# Patient Record
Sex: Female | Born: 1960 | Race: White | Hispanic: No | Marital: Married | Smoking: Never smoker
Health system: Southern US, Community
[De-identification: ages and names within clinical notes are randomized; demographics above are authoritative.]

## PROBLEM LIST (undated history)

## (undated) DIAGNOSIS — S86019A Strain of unspecified Achilles tendon, initial encounter: Secondary | ICD-10-CM

## (undated) DIAGNOSIS — G4730 Sleep apnea, unspecified: Secondary | ICD-10-CM

## (undated) DIAGNOSIS — R7303 Prediabetes: Secondary | ICD-10-CM

## (undated) DIAGNOSIS — I10 Essential (primary) hypertension: Secondary | ICD-10-CM

## (undated) DIAGNOSIS — Z9109 Other allergy status, other than to drugs and biological substances: Secondary | ICD-10-CM

## (undated) DIAGNOSIS — R739 Hyperglycemia, unspecified: Secondary | ICD-10-CM

## (undated) DIAGNOSIS — E7800 Pure hypercholesterolemia, unspecified: Secondary | ICD-10-CM

## (undated) HISTORY — DX: Essential (primary) hypertension: I10

## (undated) HISTORY — DX: Pure hypercholesterolemia, unspecified: E78.00

## (undated) HISTORY — DX: Other allergy status, other than to drugs and biological substances: Z91.09

## (undated) HISTORY — DX: Sleep apnea, unspecified: G47.30

## (undated) HISTORY — DX: Hyperglycemia, unspecified: R73.9

## (undated) HISTORY — DX: Strain of unspecified achilles tendon, initial encounter: S86.019A

---

## 2006-02-18 ENCOUNTER — Ambulatory Visit: Payer: Self-pay | Admitting: Internal Medicine

## 2006-03-27 ENCOUNTER — Ambulatory Visit: Payer: Self-pay | Admitting: Internal Medicine

## 2006-04-26 ENCOUNTER — Ambulatory Visit: Payer: Self-pay | Admitting: Internal Medicine

## 2006-06-30 HISTORY — PX: BREAST BIOPSY: SHX20

## 2007-04-15 ENCOUNTER — Ambulatory Visit: Payer: Self-pay | Admitting: Internal Medicine

## 2008-05-05 ENCOUNTER — Ambulatory Visit: Payer: Self-pay | Admitting: Internal Medicine

## 2009-04-23 ENCOUNTER — Ambulatory Visit: Payer: Self-pay | Admitting: Internal Medicine

## 2009-05-10 ENCOUNTER — Ambulatory Visit: Payer: Self-pay | Admitting: Internal Medicine

## 2009-05-17 ENCOUNTER — Ambulatory Visit: Payer: Self-pay | Admitting: Internal Medicine

## 2009-06-27 ENCOUNTER — Ambulatory Visit: Payer: Self-pay | Admitting: Unknown Physician Specialty

## 2009-07-03 ENCOUNTER — Ambulatory Visit: Payer: Self-pay | Admitting: Unknown Physician Specialty

## 2009-07-03 HISTORY — PX: OTHER SURGICAL HISTORY: SHX169

## 2009-10-29 IMAGING — US ABDOMEN ULTRASOUND
1 series · 17 of 25 positions shown · non-contrast
Comparison: none

REASON FOR EXAM: abdominal pain
COMMENTS:

[Series 1: abdomen ultrasound · 17 of 100 slices shown]
[im 1/100]
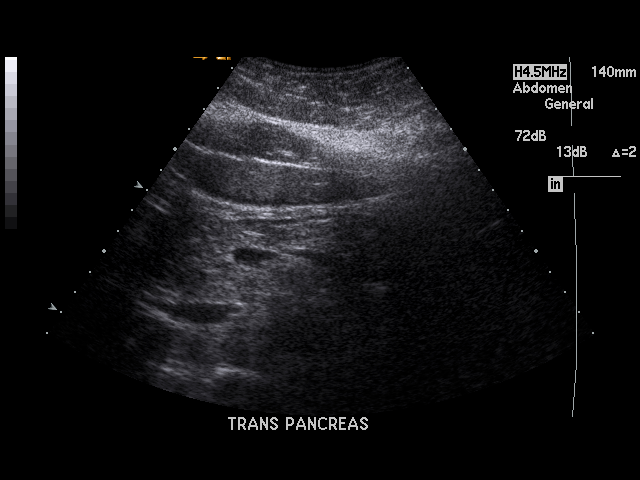
[im 9/100]
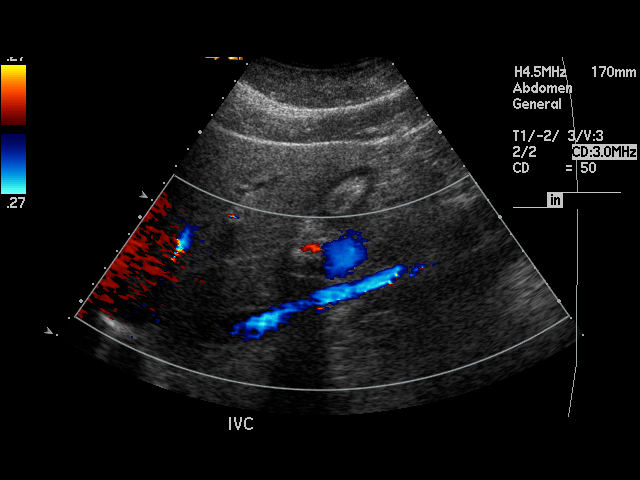
[im 13/100]
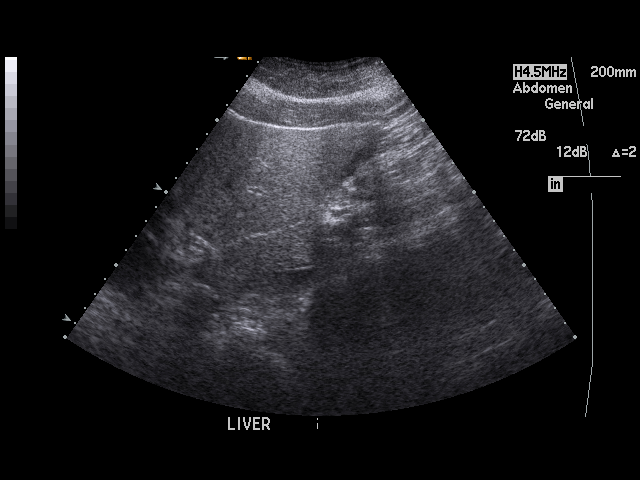
[im 21/100]
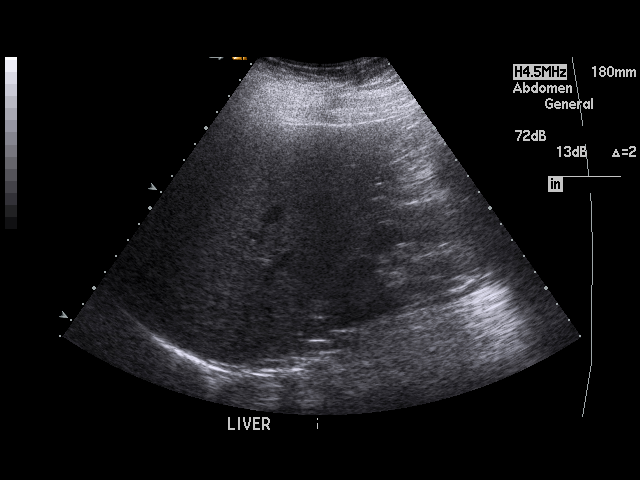
[im 25/100]
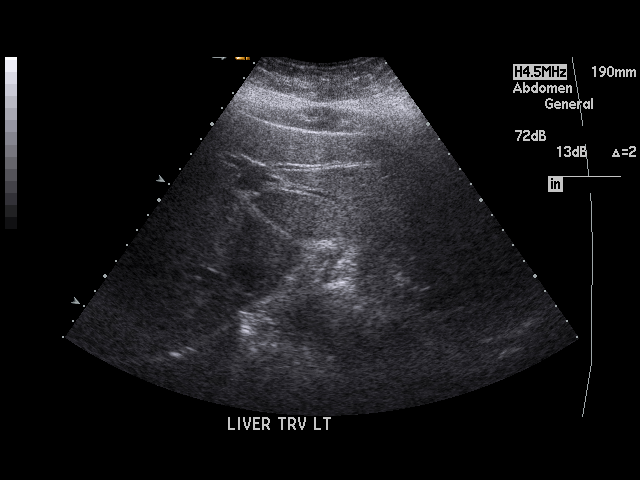
[im 34/100]
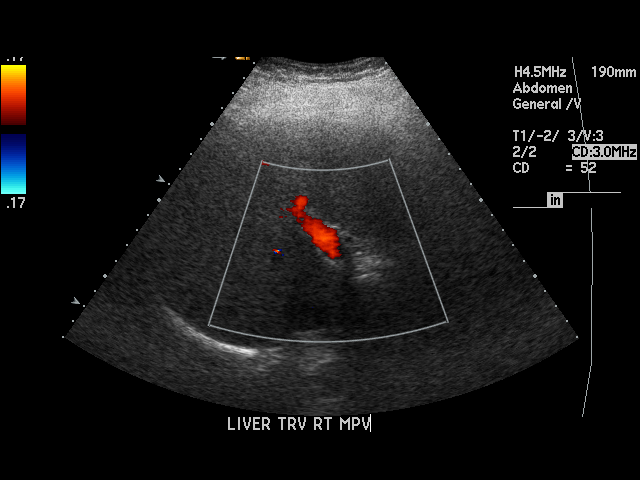
[im 38/100]
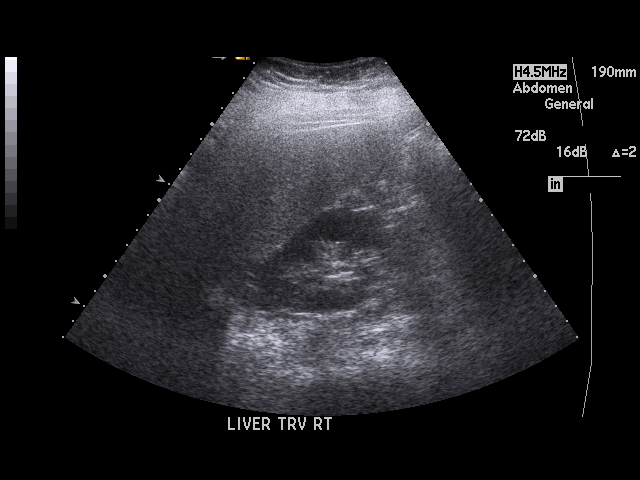
[im 46/100]
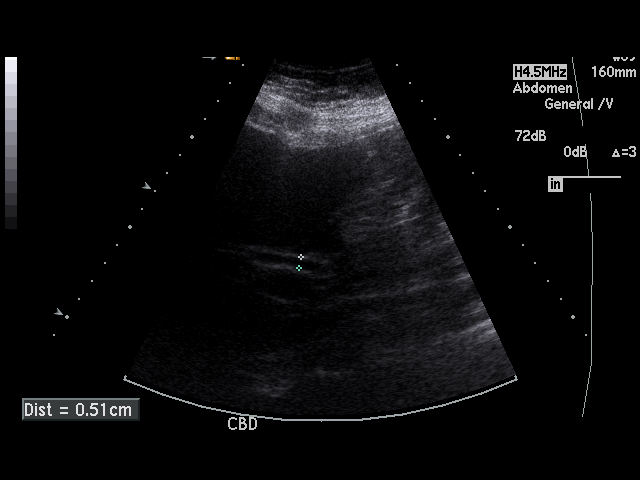
[im 50/100]
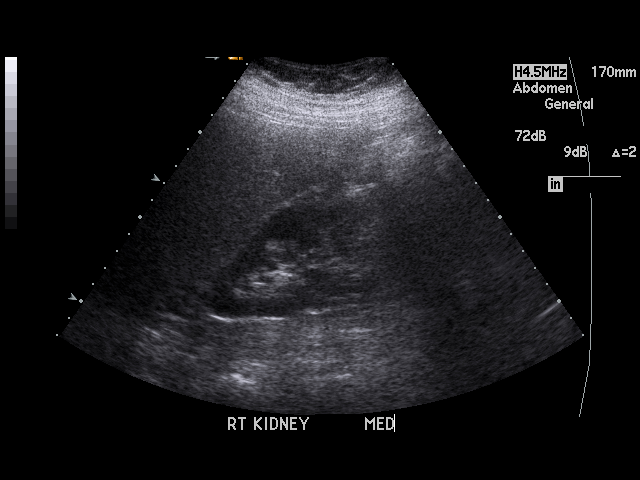
[im 54/100]
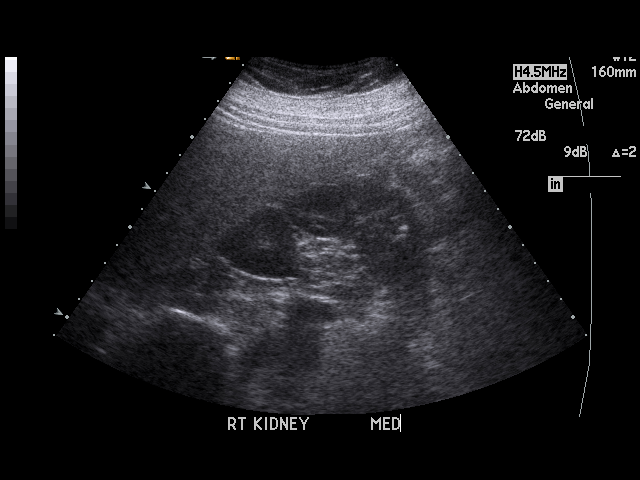
[im 62/100]
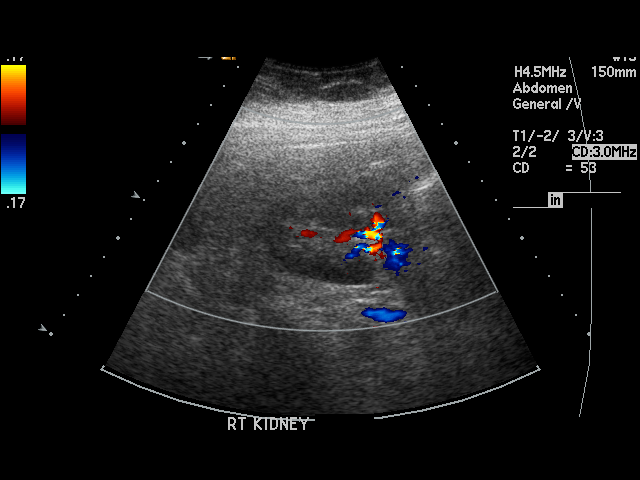
[im 67/100]
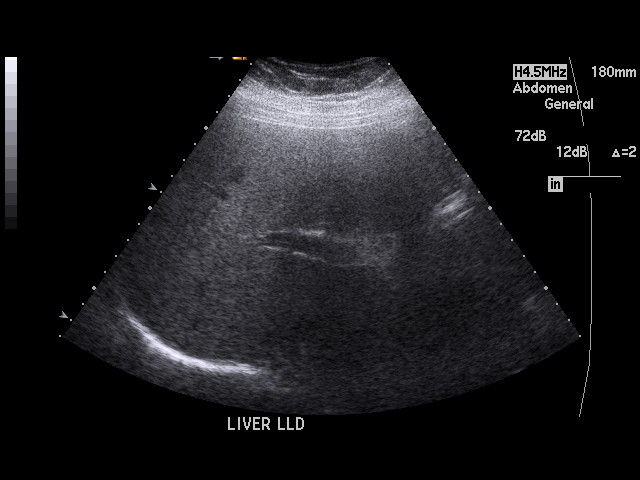
[im 75/100]
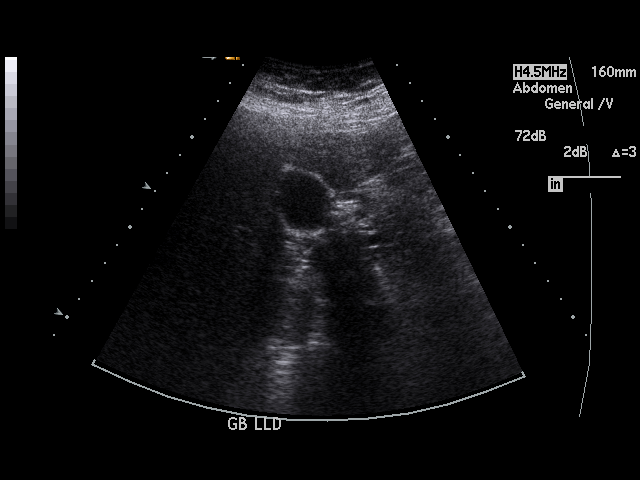
[im 79/100]
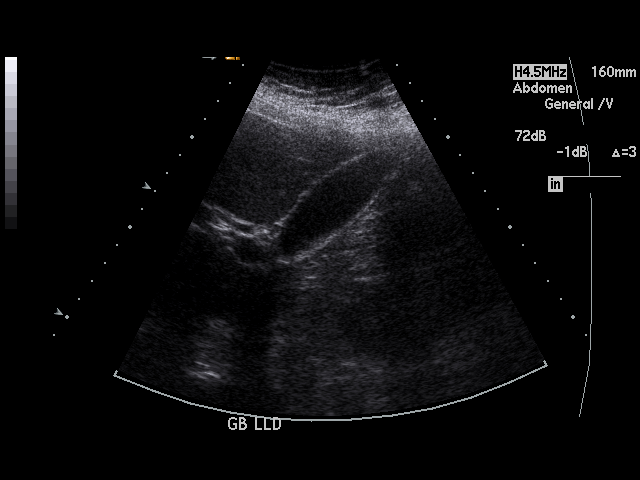
[im 87/100]
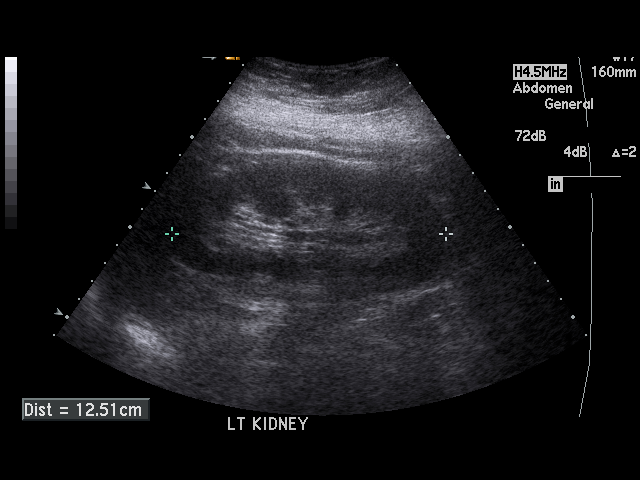
[im 91/100]
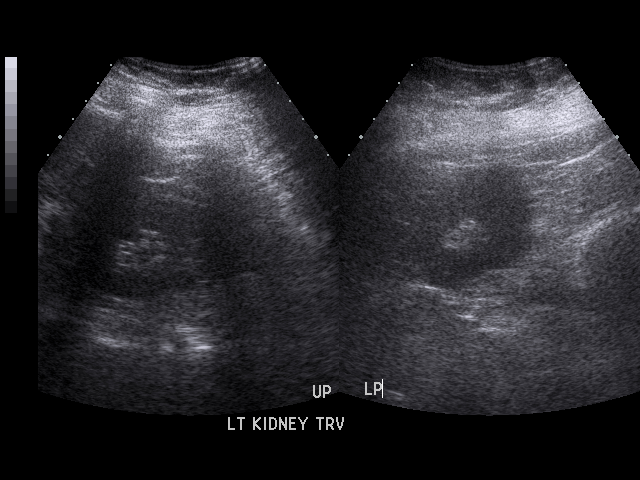
[im 100/100]
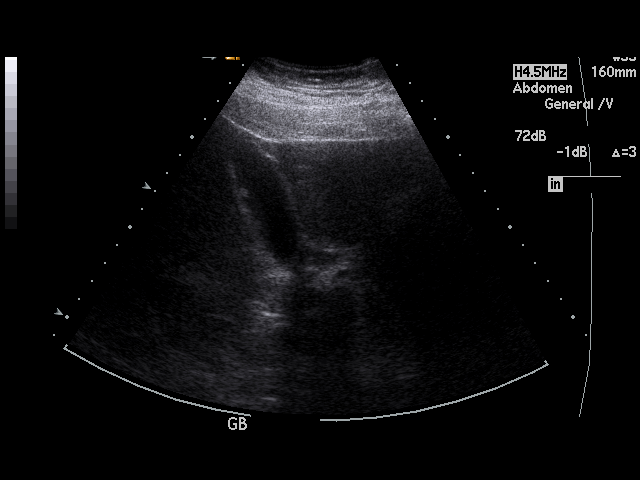

[17 of 25 positions shown; findings below may reference images not displayed]

PROCEDURE:     US  - US ABDOMEN GENERAL SURVEY  - April 23, 2009  [DATE]

RESULT:     Sonographic evaluation of the abdomen is performed in the
standard fashion. The pancreas is only partially visualized with no gross
abnormality seen. The included images of the aorta and inferior vena cava
show no acute abnormality. The hepatic echotexture appears to be normal with
no ductal dilation. The common bile duct diameter is somewhat marginal in
size being just above the upper limits of normal at 5.1 mm. There is a small
area of echogenicity along the gallbladder wall which is nonmobile and may
represent a 4.3 mm polyp. The gallbladder wall thickness is 2.0 mm. The
spleen and kidneys appear unremarkable.
IMPRESSION: 1. Probable small gallbladder wall polyp.
2. Limited visualization of the pancreas because of overlying bowel gas.

## 2010-01-02 IMAGING — CR DG CHEST 2V
1 series · 2 of 2 positions shown · non-contrast
Comparison: none

REASON FOR EXAM: htn
COMMENTS:

PROCEDURE:     DXR - DXR CHEST PA (OR AP) AND LATERAL  - June 27, 2009  [DATE]
RESULT:     There is shallow inspiratory effort. Basilar atelectasis is
suggested bilaterally. The heart is normal in size. There is no edema,
infiltrate, effusion or pneumothorax.

[Series 1: view not recorded · 0.17mm/px · 2 of 2 slices shown]
[im 1/2]
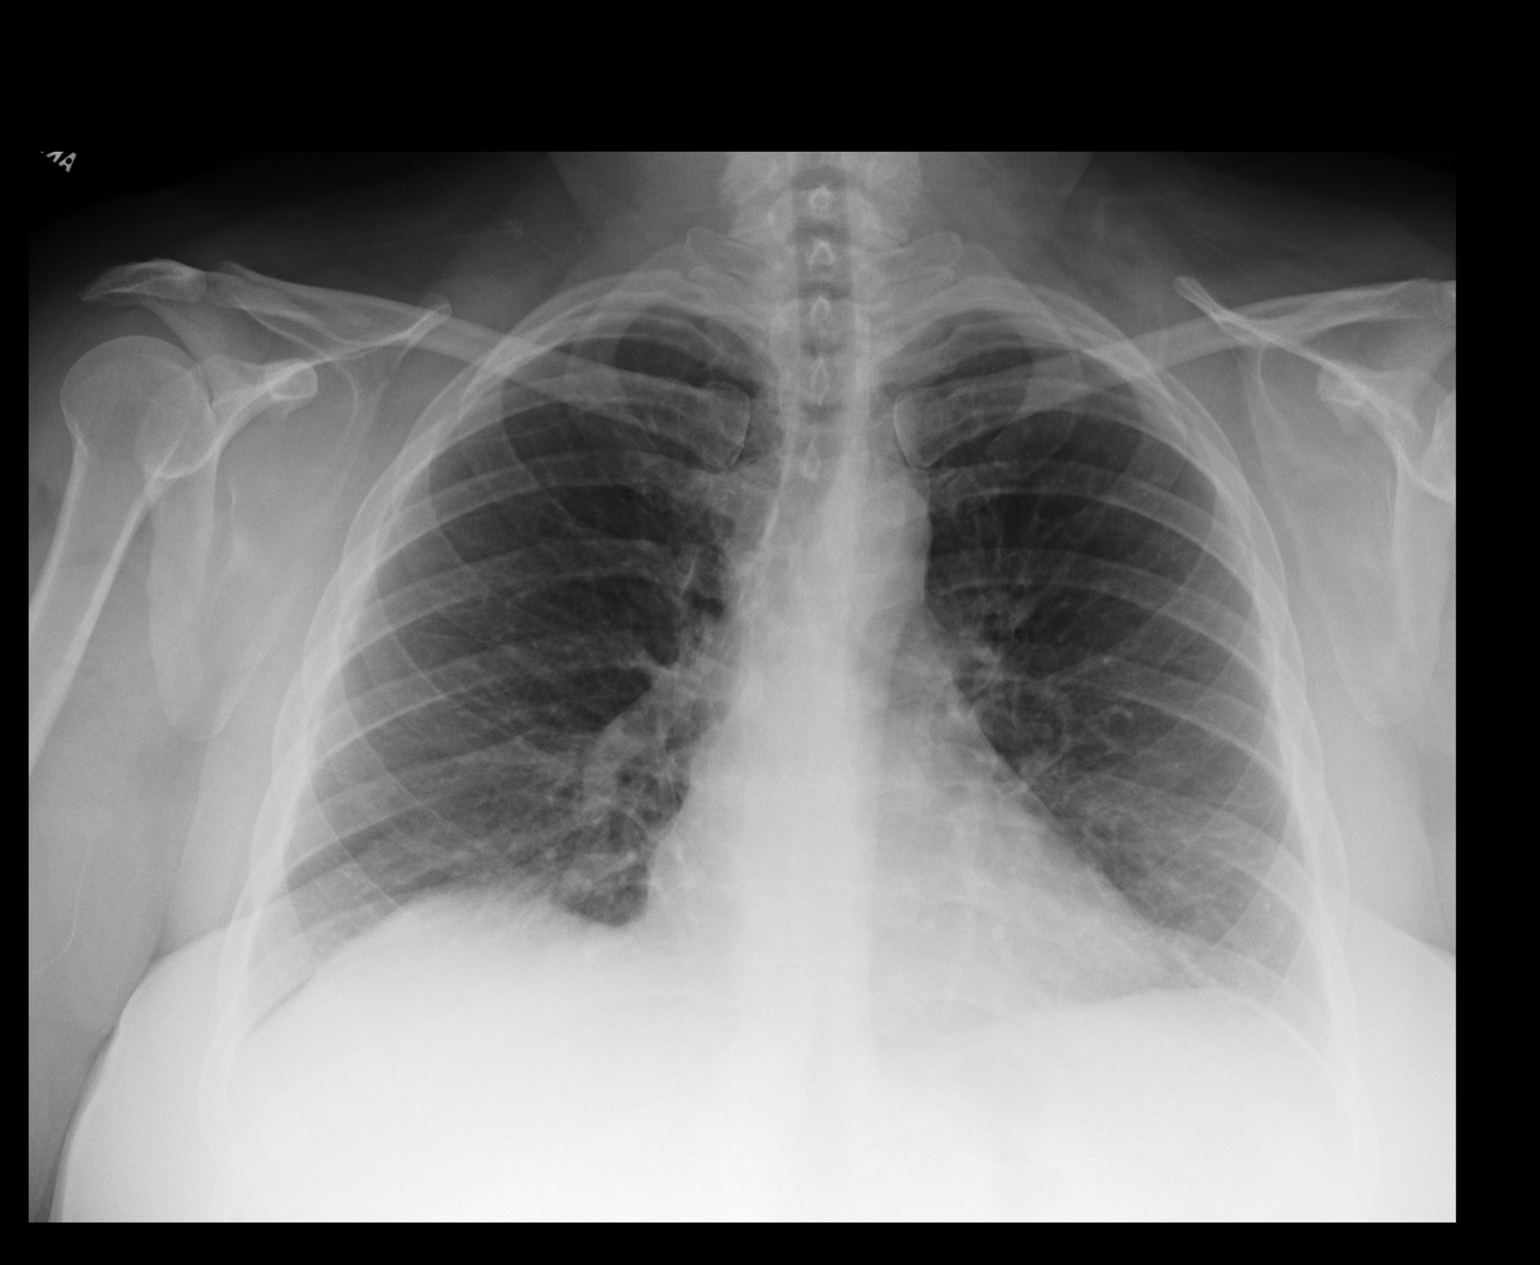
[im 2/2]
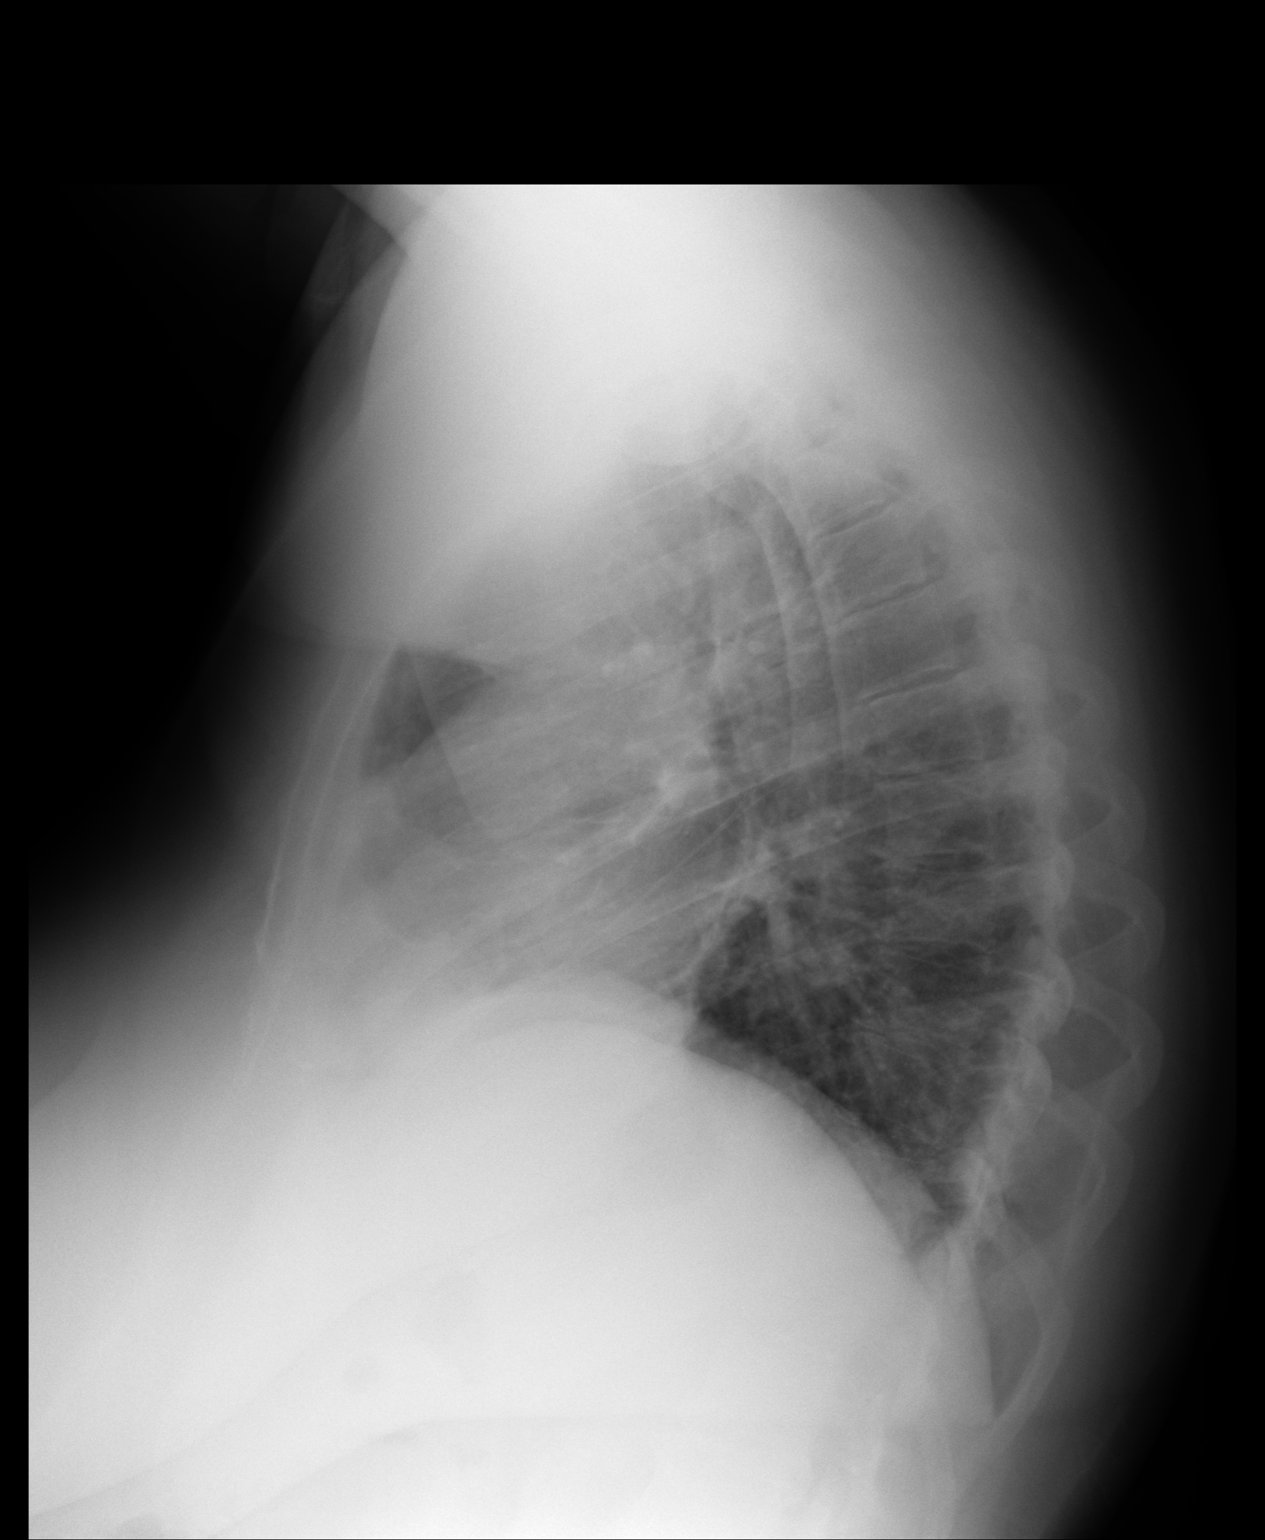

[2 of 2 positions shown; findings below may reference images not displayed]

IMPRESSION: 1. Shallow inspiration with basilar atelectasis bilaterally. This is minimal
linear atelectasis.

## 2010-05-15 ENCOUNTER — Ambulatory Visit: Payer: Self-pay | Admitting: Internal Medicine

## 2010-07-08 ENCOUNTER — Ambulatory Visit: Payer: Self-pay | Admitting: Internal Medicine

## 2010-07-31 ENCOUNTER — Ambulatory Visit: Payer: Self-pay | Admitting: Internal Medicine

## 2010-08-29 ENCOUNTER — Ambulatory Visit: Payer: Self-pay | Admitting: Internal Medicine

## 2012-05-25 ENCOUNTER — Encounter: Payer: Self-pay | Admitting: *Deleted

## 2012-05-26 ENCOUNTER — Encounter: Payer: Self-pay | Admitting: Internal Medicine

## 2012-05-26 ENCOUNTER — Ambulatory Visit (INDEPENDENT_AMBULATORY_CARE_PROVIDER_SITE_OTHER): Payer: BC Managed Care – PPO | Admitting: Internal Medicine

## 2012-05-26 VITALS — BP 132/78 | HR 96 | Temp 98.8°F | Ht 62.0 in | Wt 228.0 lb

## 2012-05-26 DIAGNOSIS — E119 Type 2 diabetes mellitus without complications: Secondary | ICD-10-CM | POA: Insufficient documentation

## 2012-05-26 DIAGNOSIS — R5383 Other fatigue: Secondary | ICD-10-CM

## 2012-05-26 DIAGNOSIS — R7309 Other abnormal glucose: Secondary | ICD-10-CM

## 2012-05-26 DIAGNOSIS — I10 Essential (primary) hypertension: Secondary | ICD-10-CM | POA: Insufficient documentation

## 2012-05-26 DIAGNOSIS — G4730 Sleep apnea, unspecified: Secondary | ICD-10-CM | POA: Insufficient documentation

## 2012-05-26 DIAGNOSIS — E7800 Pure hypercholesterolemia, unspecified: Secondary | ICD-10-CM

## 2012-05-26 DIAGNOSIS — R739 Hyperglycemia, unspecified: Secondary | ICD-10-CM

## 2012-05-26 LAB — BASIC METABOLIC PANEL
BUN: 15 mg/dL (ref 6–23)
Chloride: 102 mEq/L (ref 96–112)
GFR: 126.64 mL/min (ref >60.00)
Potassium: 3.7 mEq/L (ref 3.5–5.1)
Sodium: 137 mEq/L (ref 135–145)

## 2012-05-26 LAB — CBC WITH DIFFERENTIAL/PLATELET
Eosinophils Relative: 1.8 % (ref 0.0–5.0)
HCT: 43.2 % (ref 36.0–46.0)
Hemoglobin: 14.5 g/dL (ref 12.0–15.0)
Lymphs Abs: 2.6 10*3/uL (ref 0.7–4.0)
Monocytes Relative: 4.4 % (ref 3.0–12.0)
Neutro Abs: 4.4 10*3/uL (ref 1.4–7.7)
Platelets: 388 10*3/uL (ref 150.0–400.0)
WBC: 7.5 10*3/uL (ref 4.5–10.5)

## 2012-05-26 LAB — HEMOGLOBIN A1C: Hgb A1c MFr Bld: 6.9 % — ABNORMAL HIGH (ref 4.6–6.5)

## 2012-05-26 LAB — LDL CHOLESTEROL, DIRECT: Direct LDL: 186.5 mg/dL

## 2012-05-26 LAB — LIPID PANEL
Cholesterol: 238 mg/dL — ABNORMAL HIGH (ref 0–200)
Total CHOL/HDL Ratio: 7
VLDL: 39.2 mg/dL (ref 0.0–40.0)

## 2012-05-26 LAB — HEPATIC FUNCTION PANEL
ALT: 26 U/L (ref 0–35)
AST: 22 U/L (ref 0–37)
Alkaline Phosphatase: 55 U/L (ref 39–117)
Total Bilirubin: 0.7 mg/dL (ref 0.3–1.2)

## 2012-05-26 LAB — TSH: TSH: 2.63 u[IU]/mL (ref 0.35–5.50)

## 2012-05-26 NOTE — Progress Notes (Signed)
  Subjective:    Patient ID: Caroline Meyers, female    DOB: 1961-04-14, 51 y.o.   MRN: [id number]  HPI 51 year old female with past history of hypertension, hypercholesterolemia, hyperglycemia and sleep apnea who comes in today for a scheduled follow up.  She has not been taking her HCTZ for 2 months.  Not following her pressure regularly.  Blood pressure when she has had it checked - 130 systolic readings.  She previously fell and injured her neck.  Had a bulging disc in her neck.  Went to PT for evaluation and treatment.  Breathing stable.  Recently treated for a respiratory infection.  She has recently adjusted her diet and has started walking.  Plans to get more serious about diet and exercise.  She has been having problems with a trigger finger - left fourth finger.    Past Medical History  Diagnosis Date  . Environmental allergies   . Hypercholesteremia   . Hypertension   . Sleep apnea   . Achilles tendon tear     age 89  . Hyperglycemia     Review of Systems Patient denies any headache, lightheadedness or dizziness.  No increased sinus congestion or drainage.  No chest pain, tightness or palpitations.  No increased shortness of breath, cough or congestion.  No nausea or vomiting.  No abdominal pain or cramping.  No bowel change, such as diarrhea, constipation, BRBPR or melana.  No urine change.        Objective:   Physical Exam Filed Vitals:   05/26/12 0813  BP: 132/78  Pulse: 96  Temp: 98.8 F (21.37 C)   51 year old in no acute distress.   HEENT:  Nares - clear.  OP- without lesions or erythema.  NECK:  Supple, nontender.  No audible bruit.   HEART:  Appears to be regular. LUNGS:  Without crackles or wheezing audible.  Respirations even and unlabored.   RADIAL PULSE:  Equal bilaterally.  ABDOMEN:  Soft, nontender.  No audible abdominal bruit.   EXTREMITIES:  No increased edema to be present.                     Assessment & Plan:  MSK.  S/P lumbar microdiskectomy 07/03/09.    Doing well.  Recently had problems with a bulging disc in her neck. PT.  Doing better now.    GI.  Previous abdominal ultrasound revealed a small gallbladder polyp.  Saw Dr Katrinka Blazing.  Felt no further w/up warranted.  Currently asymptomatic.    CARDIOVASCULAR.  Previous ECHO revealed LVH changes with mild mitral regurgitation and mild tricuspid regurgitation.   Currently asymptomatic.    INCREASED PSYCHOSOCIAL STRESSORS.  Doing well.  Follow.   BASAL CELL CARCINOMA.  Sees Dr Cheree Ditto.     TRIGGER FINGER.  Place finger splint.  Notify me if persistent problems.  If persistent problems, will need referral for possible injection.   GYN.  Followed by GYN for pelvic and pap.  Due follow up.  Plans to call for appt soon.    HEALTH MAINTENANCE.  GYN exams through her gynecologist.  Need to obtain mammogram results.

## 2012-05-26 NOTE — Patient Instructions (Addendum)
It was nice seeing you today.  I want you to continue to exercise and adjust your diet.  Monitor your blood pressure and send me readings in a few weeks.  Let me know if any problems.

## 2012-05-27 ENCOUNTER — Encounter: Payer: Self-pay | Admitting: Internal Medicine

## 2012-05-27 NOTE — Assessment & Plan Note (Signed)
Low cholesterol diet and exercise.  On no medication.  Check lipid panel.  

## 2012-05-27 NOTE — Assessment & Plan Note (Signed)
Follow

## 2012-05-27 NOTE — Assessment & Plan Note (Signed)
Low carb diet, exercise and weight loss.  Check fasting sugar.

## 2012-05-27 NOTE — Assessment & Plan Note (Signed)
On no medication now.  Blood pressure on my check today - 132/78.  Remain off medication now.  Follow.

## 2012-06-07 ENCOUNTER — Telehealth: Payer: Self-pay | Admitting: Internal Medicine

## 2012-06-07 NOTE — Telephone Encounter (Signed)
Pt needs an appt in 2-3 weeks (to follow up regarding her blood sugar and cholesterol).  30 minutes if possible.  Thanks.

## 2012-06-07 NOTE — Progress Notes (Signed)
Patient stated that she would call us back to make appointment

## 2012-06-08 NOTE — Telephone Encounter (Signed)
Left message for pt to call back and schedule

## 2012-07-17 ENCOUNTER — Telehealth: Payer: Self-pay | Admitting: Internal Medicine

## 2012-07-17 NOTE — Telephone Encounter (Signed)
Pt has never called back to schedule appt.  Needs a follow up appt - to follow up on her cholesterol and sugar.  Thanks.

## 2012-07-21 NOTE — Telephone Encounter (Signed)
scheduled

## 2012-08-09 ENCOUNTER — Encounter: Payer: Self-pay | Admitting: Internal Medicine

## 2012-08-09 ENCOUNTER — Ambulatory Visit (INDEPENDENT_AMBULATORY_CARE_PROVIDER_SITE_OTHER): Payer: BC Managed Care – PPO | Admitting: Internal Medicine

## 2012-08-09 VITALS — BP 110/88 | HR 92 | Temp 98.6°F | Ht 62.0 in | Wt 227.8 lb

## 2012-08-09 DIAGNOSIS — R7309 Other abnormal glucose: Secondary | ICD-10-CM

## 2012-08-09 DIAGNOSIS — I10 Essential (primary) hypertension: Secondary | ICD-10-CM

## 2012-08-09 DIAGNOSIS — R739 Hyperglycemia, unspecified: Secondary | ICD-10-CM

## 2012-08-09 DIAGNOSIS — E7800 Pure hypercholesterolemia, unspecified: Secondary | ICD-10-CM

## 2012-08-09 MED ORDER — ATORVASTATIN CALCIUM 10 MG PO TABS: 10.0000 mg | ORAL_TABLET | Freq: Every day | ORAL | Status: DC

## 2012-08-10 ENCOUNTER — Encounter: Payer: Self-pay | Admitting: Internal Medicine

## 2012-08-10 NOTE — Assessment & Plan Note (Signed)
Recent fasting lipid profile revealed total cholesterol 238, triglycerides 196, HDL 34, LDL 187.  Start lipitor 10mg  q day.  Check liver panel and cholesterol with next fasting labs.  Low cholesterol diet.

## 2012-08-10 NOTE — Assessment & Plan Note (Signed)
Blood pressure on recheck 118/80.  On no medication.  Follow.

## 2012-08-10 NOTE — Progress Notes (Signed)
  Subjective:    Patient ID: Caroline Meyers, female    DOB: [birth year], 52 y.o.   MRN: [id number]  HPI 52 year old female with past history of hypertension, hypercholesterolemia, hyperglycemia and sleep apnea who comes in today for a scheduled follow up.  She states she is doing relatively well.  Got a new job.  Stress better.  On no blood pressure medication.  No headache.  No chest pain or tightness. Discussed her recent labs.  Sugar elevated.  We discussed low carb/diabetic diet.  She has been to lifestyles.  Discussed exercise.  No nausea or vomiting.  Bowels stable.  Cholesterol elevated.  Discussed starting cholesterol medication.     Past Medical History  Diagnosis Date  . Environmental allergies   . Hypercholesteremia   . Hypertension   . Sleep apnea   . Achilles tendon tear     age 59  . Hyperglycemia     Current Outpatient Prescriptions on File Prior to Visit  Medication Sig Dispense Refill  . glucose blood test strip Check blood sugar q day      . norethindrone (MICRONOR,CAMILA,ERRIN) 0.35 MG tablet Take 1 tablet by mouth daily.       No current facility-administered medications on file prior to visit.    Review of Systems Patient denies any headache, lightheadedness or dizziness.  No increased sinus congestion or drainage.  No chest pain, tightness or palpitations.  No increased shortness of breath, cough or congestion.  No nausea or vomiting.  No acid reflux.  No abdominal pain or cramping.  No bowel change, such as diarrhea, constipation, BRBPR or melana.  No urine change.        Objective:   Physical Exam  Filed Vitals:   08/09/12 0809  BP: 110/88  Pulse: 92  Temp: 98.6 F (39 C)   52 year old in no acute distress.   HEENT:  Nares - clear.  OP- without lesions or erythema.  NECK:  Supple, nontender.  No audible bruit.   HEART:  Appears to be regular. (1/VI systolic murmur).   LUNGS:  Without crackles or wheezing audible.  Respirations even and unlabored.   RADIAL  PULSE:  Equal bilaterally.  ABDOMEN:  Soft, nontender.  No audible abdominal bruit.   EXTREMITIES:  No increased edema to be present.   SKIN:  Feet without lesions.                      Assessment & Plan:  MSK.  S/P lumbar microdiskectomy 07/03/09.   Doing well.  Recently had problems with a bulging disc in her neck. PT.  Doing better now.    GI.  Previous abdominal ultrasound revealed a small gallbladder polyp.  Saw Dr Katrinka Blazing.  Felt no further w/up warranted.  Currently asymptomatic.    CARDIOVASCULAR.  Previous ECHO revealed LVH changes with mild mitral regurgitation and mild tricuspid regurgitation.   Currently asymptomatic.    INCREASED PSYCHOSOCIAL STRESSORS.  Doing well.  Follow.   BASAL CELL CARCINOMA.  Sees Dr Cheree Ditto.      GYN.  Followed by GYN for pelvic and pap.    HEALTH MAINTENANCE.  GYN exams through her gynecologist.  Obtain mammogram results.

## 2012-08-10 NOTE — Assessment & Plan Note (Signed)
Recent a1c 6.8.  Low carb/diabetic diet.  Follow.  Exercise.

## 2012-08-27 ENCOUNTER — Telehealth: Payer: Self-pay | Admitting: *Deleted

## 2012-08-27 NOTE — Telephone Encounter (Signed)
Called patient to find out what was going on. Patient stated that she was having swelling in hands and feet. Also having joint pain. Patient wants to wait and see how she feels after this weekend. She said that she is not sick or anything but she thinks that it is the atorvastatin that is making her feel this way. She will let us know if there is a change.

## 2012-08-27 NOTE — Telephone Encounter (Signed)
Patient left message on voicemail stating she is going to stop a medication that was prescribed to her by Dr. Lorin Picket, she did not leave the medication name. But she feels uncomfortable taking it and is not going to take it anymore. Would like a return call.

## 2012-10-07 ENCOUNTER — Other Ambulatory Visit (INDEPENDENT_AMBULATORY_CARE_PROVIDER_SITE_OTHER): Payer: BC Managed Care – PPO

## 2012-10-07 DIAGNOSIS — R7309 Other abnormal glucose: Secondary | ICD-10-CM

## 2012-10-07 DIAGNOSIS — I10 Essential (primary) hypertension: Secondary | ICD-10-CM

## 2012-10-07 DIAGNOSIS — R739 Hyperglycemia, unspecified: Secondary | ICD-10-CM

## 2012-10-07 DIAGNOSIS — E7800 Pure hypercholesterolemia, unspecified: Secondary | ICD-10-CM

## 2012-10-07 LAB — MICROALBUMIN / CREATININE URINE RATIO
Creatinine,U: 196.2 mg/dL
Microalb, Ur: 4.9 mg/dL — ABNORMAL HIGH (ref 0.0–1.9)

## 2012-10-07 LAB — BASIC METABOLIC PANEL
BUN: 15 mg/dL (ref 6–23)
CO2: 26 mEq/L (ref 19–32)
Calcium: 9.3 mg/dL (ref 8.4–10.5)
Glucose, Bld: 115 mg/dL — ABNORMAL HIGH (ref 70–99)
Sodium: 138 mEq/L (ref 135–145)

## 2012-10-07 LAB — HEPATIC FUNCTION PANEL
ALT: 22 U/L (ref 0–35)
Bilirubin, Direct: 0.1 mg/dL (ref 0.0–0.3)
Total Bilirubin: 1.1 mg/dL (ref 0.3–1.2)

## 2012-10-07 LAB — LIPID PANEL
HDL: 30.5 mg/dL — ABNORMAL LOW (ref >39.00)
VLDL: 39.6 mg/dL (ref 0.0–40.0)

## 2012-10-07 LAB — LDL CHOLESTEROL, DIRECT: Direct LDL: 176.7 mg/dL

## 2012-10-13 ENCOUNTER — Ambulatory Visit (INDEPENDENT_AMBULATORY_CARE_PROVIDER_SITE_OTHER): Payer: BC Managed Care – PPO | Admitting: Internal Medicine

## 2012-10-13 ENCOUNTER — Encounter: Payer: Self-pay | Admitting: Internal Medicine

## 2012-10-13 VITALS — BP 130/80 | HR 85 | Temp 98.8°F | Ht 62.0 in | Wt 229.5 lb

## 2012-10-13 DIAGNOSIS — R739 Hyperglycemia, unspecified: Secondary | ICD-10-CM

## 2012-10-13 DIAGNOSIS — G4730 Sleep apnea, unspecified: Secondary | ICD-10-CM

## 2012-10-13 DIAGNOSIS — R7309 Other abnormal glucose: Secondary | ICD-10-CM

## 2012-10-13 DIAGNOSIS — E7800 Pure hypercholesterolemia, unspecified: Secondary | ICD-10-CM

## 2012-10-13 DIAGNOSIS — I10 Essential (primary) hypertension: Secondary | ICD-10-CM

## 2012-10-13 MED ORDER — PRAVASTATIN SODIUM 10 MG PO TABS: 10.0000 mg | ORAL_TABLET | Freq: Every day | ORAL | Status: DC

## 2012-10-16 ENCOUNTER — Encounter: Payer: Self-pay | Admitting: Internal Medicine

## 2012-10-16 NOTE — Assessment & Plan Note (Signed)
Recent fasting lipid profile revealed total cholesterol 242, triglycerides 198, HDL 31, LDL 177.  Low cholesterol diet.  Had aches with lipitor.  Will start pravastatin 10mg .  Check liver panel in 6 weeks.

## 2012-10-16 NOTE — Assessment & Plan Note (Signed)
Blood pressure appears to be doing well.  On no medication.  Follow.

## 2012-10-16 NOTE — Assessment & Plan Note (Signed)
Recent a1c 6.7.  Low carb/diabetic diet.  Follow.  Exercise.

## 2012-10-16 NOTE — Progress Notes (Signed)
Subjective:    Patient ID: Caroline Meyers, female    DOB: 11-10-1960, 52 y.o.   MRN: [id number]  HPI 52 year old female with past history of hypertension, hypercholesterolemia, hyperglycemia and sleep apnea who comes in today for a scheduled follow up.  She states she is dong relatively well.  Increased stress,  Father-n-law's health issues.  Not eating like she should.  Checking her sugars.  AM sugar 110-112.  Highest sugar reading she has recorded 141.  On no blood pressure medication.  No headache.  No chest pain or tightness. Discussed her recent labs.  Sugar elevated.  cholesterol elevated.  We discussed low carb/diabetic diet.  She has been to lifestyles.  Discussed exercise.  No nausea or vomiting.  Bowels stable.  Discussed starting cholesterol medication.     Past Medical History  Diagnosis Date  . Environmental allergies   . Hypercholesteremia   . Hypertension   . Sleep apnea   . Achilles tendon tear     age 20  . Hyperglycemia     Current Outpatient Prescriptions on File Prior to Visit  Medication Sig Dispense Refill  . glucose blood test strip Check blood sugar q day      . norethindrone (MICRONOR,CAMILA,ERRIN) 0.35 MG tablet Take 1 tablet by mouth daily.       No current facility-administered medications on file prior to visit.    Review of Systems Patient denies any headache, lightheadedness or dizziness.  No increased sinus congestion or drainage.  No chest pain, tightness or palpitations.  No increased shortness of breath, cough or congestion.  No nausea or vomiting.  No acid reflux.  No abdominal pain or cramping.  No bowel change, such as diarrhea, constipation, BRBPR or melana.  No urine change.  Increased stress as outlined.  She feels she is handling things relatively well.  Plans to get more serious about her diet and exercise.  Previously stopped cholesterol medication secondary to joint pain.       Objective:   Physical Exam  Filed Vitals:   10/13/12 0929  BP:  130/80  Pulse: 85  Temp: 98.8 F (37.1 C)   Blood pressure recheck:  124/82, pulse 39  52 year old in no acute distress.   HEENT:  Nares - clear.  OP- without lesions or erythema.  NECK:  Supple, nontender.  No audible bruit.   HEART:  Appears to be regular. (1/VI systolic murmur).   LUNGS:  Without crackles or wheezing audible.  Respirations even and unlabored.   RADIAL PULSE:  Equal bilaterally.  ABDOMEN:  Soft, nontender.  No audible abdominal bruit.   EXTREMITIES:  No increased edema to be present.   SKIN:  Feet without lesions.                      Assessment & Plan:  MSK.  S/P lumbar microdiskectomy 07/03/09.   Doing well.  Recently had problems with a bulging disc in her neck. PT.  Doing better now.    GI.  Previous abdominal ultrasound revealed a small gallbladder polyp.  Saw Dr Katrinka Blazing.  Felt no further w/up warranted.  Currently asymptomatic.    CARDIOVASCULAR.  Previous ECHO revealed LVH changes with mild mitral regurgitation and mild tricuspid regurgitation.   Currently asymptomatic.    INCREASED PSYCHOSOCIAL STRESSORS.  Increased stress as outlined.  Feels she is handling things relatively well.  Follow.   BASAL CELL CARCINOMA.  Sees Dr Cheree Ditto.  GYN.  Followed by GYN for pelvic and pap.    HEALTH MAINTENANCE.  GYN exams through her gynecologist.  Obtain mammogram results.

## 2012-10-16 NOTE — Assessment & Plan Note (Signed)
Follow

## 2012-11-14 ENCOUNTER — Emergency Department: Payer: Self-pay | Admitting: Emergency Medicine

## 2012-11-17 ENCOUNTER — Encounter: Payer: Self-pay | Admitting: Internal Medicine

## 2012-11-17 ENCOUNTER — Telehealth: Payer: Self-pay | Admitting: Internal Medicine

## 2012-11-17 NOTE — Telephone Encounter (Signed)
Patient Information:  Caller Name: Lillyanna  Phone: [phone]  Patient: Caroline Meyers, Caroline Meyers  Gender: Female  DOB: 1961/06/20  Age: 52 Years  PCP: [facility]  Pregnant: No  Office Follow Up:  Does the office need to follow up with this patient?: No  Instructions For The Office: (See other note in chart where pt called today about her cholesterol medication).   Symptoms  Reason For Call & Symptoms: Pt calling today 11/17/12 regarding she was in ED on 11/14/12 for back pain related to sciatica.  Was prescribed Hydrocodone, Prednisone, and another medication.  Said she is feeling a lot better but still having some mild throbbing.  Wants to know if she should be better by now.  Pt said she does not take the Hydrocodone unless pain is more severe.  Also wants to know if she should be doing any exercises.  Reviewed Health History In EMR: Yes  Reviewed Medications In EMR: Yes  Reviewed Allergies In EMR: Yes  Reviewed Surgeries / Procedures: Yes  Date of Onset of Symptoms: 11/13/2012  Treatments Tried: medication  Treatments Tried Worked: Yes OB / GYN:  LMP: Unknown  Guideline(s) Used:  Back Pain  Disposition Per Guideline:   Home Care  Reason For Disposition Reached:   Back pain  Advice Given:  Reassurance:  Twisting or heavy lifting can cause back pain.  With treatment, the pain most often goes away in 1-2 weeks.  You can treat most back pain at home.  Here is some care advice that should help.  Cold or Heat:  Heat Pack: If pain lasts over 2 days, apply heat to the sore area. Use a heat pack, heating pad, or warm wet washcloth. Do this for 10 minutes, then as needed. For widespread stiffness, take a hot bath or hot shower instead. Move the sore area under the warm water.  Sleep:  Sleep on your side with a pillow between your knees. If you sleep on your back, put a pillow under your knees.  Avoid sleeping on your stomach.  Your mattress should be firm. Avoid waterbeds.  Activity  Keep doing your day-to-day activities if it is not too painful. Staying active is better than resting.  Avoid anything that makes your pain worse. Avoid heavy lifting, twisting, and too much exercise until your back heals.  You do not need to stay in bed.  Pain Medicines:  Use the lowest amount of medicine that makes your pain feel better.  Call Back If:  Numbness or weakness occur  Bowel/bladder problems occur  Pain lasts for more than 2 weeks  You become worse.  Patient Will Follow Care Advice:  YES

## 2012-11-17 NOTE — Telephone Encounter (Signed)
Pt aware & states that she will call back to schedule a f/u soon

## 2012-11-17 NOTE — Telephone Encounter (Signed)
Caller: Kebrina/Patient; Phone: [phone]; Reason for Call: Pt calling today 11/17/12 regarding Dr.  Lorin Picket put her on Prevastatin for her cholesterol on 10/13/12 and she is supposed to make a follow up appt.  She took herself off of this medication because it was causing her joint pain and making her hungry.  She has not been taking the medication for the past two weeks now.  She wants to know if MD wants to put her on a different medication before she comes in for the follow up appt.  PLEASE CALL PT BACK AT [phone].  Pt pharmacy is [facility] on [address], [phone] in case MD wants to call in new medication.  Thanks.

## 2012-11-17 NOTE — Telephone Encounter (Signed)
Would recommend remaining off the cholesterol medication for now, and let the acute back pain resolve.  Keep f/u appt.  Can discuss more with her then.

## 2012-11-26 ENCOUNTER — Ambulatory Visit (INDEPENDENT_AMBULATORY_CARE_PROVIDER_SITE_OTHER): Payer: BC Managed Care – PPO | Admitting: Adult Health

## 2012-11-26 ENCOUNTER — Encounter: Payer: Self-pay | Admitting: Adult Health

## 2012-11-26 VITALS — BP 126/82 | HR 80 | Resp 12 | Wt 227.0 lb

## 2012-11-26 DIAGNOSIS — M545 Low back pain: Secondary | ICD-10-CM

## 2012-11-26 DIAGNOSIS — M5440 Lumbago with sciatica, unspecified side: Secondary | ICD-10-CM | POA: Insufficient documentation

## 2012-11-26 MED ORDER — METHOCARBAMOL 750 MG PO TABS: ORAL_TABLET | ORAL | Status: DC

## 2012-11-26 NOTE — Assessment & Plan Note (Signed)
Improvement with prednisone taper. Continue Percocet one tablet every 6 hours as needed for pain. Patient has mainly been taking this at bedtime. Continue muscle relaxer 3 times a day as needed for muscle spasms. Ibuprofen 600 mg every 6 hours for 3-4 days. Advised to take with food. Apply ice alternating with heat for 15 minutes at a time. She is to do this 3-4 times a day as needed. Reports loss of bowel or bladder, worsening symptoms. Discussed that low back pain takes weeks for complete resolution. Pain will improve with time; however, it will not occur overnight. Note, greater than 25 minutes were spent in education and counseling regarding low back pain. Patient's anxiety was increased secondary to her history of back surgery and fear of further damage.

## 2012-11-26 NOTE — Progress Notes (Signed)
  Subjective:    Patient ID: Caroline Meyers, female    DOB: 05/02/1961, 52 y.o.   MRN: [id number]  HPI  Patient is a pleasant 52 year old female with history of lumbar microdiscectomy (2011) who presents to clinic for followup recent ED visit for low back pain. [place] of [place] golf cart and noticed immediate low back pain that radiated down her right buttocks and thigh. Patient denies loss of bowel or bladder, numbness or tingling. Patient is ambulatory. [place] and was started on muscle relaxers, pain medication and a prednisone taper. She has completed the prednisone taper. Patient is feeling improvement. However she is concerned that she is still having discomfort. Her concern is mainly stemming from her history of having spinal surgery.   Review of Systems  Genitourinary: Negative for difficulty urinating.  Musculoskeletal: Positive for back pain. Negative for gait problem.  Neurological: Negative for weakness and numbness.  Psychiatric/Behavioral: The patient is nervous/anxious.        Objective:   Physical Exam  Constitutional: She is oriented to person, place, and time.  Overweight, pleasant female in no apparent distress.  Cardiovascular: Normal rate and regular rhythm.   Pulmonary/Chest: Effort normal. No respiratory distress.  Musculoskeletal: She exhibits no edema and no tenderness.  Ambulating without any difficulty. Observed as she got up out of chair without any difficulty.  Neurological: She is alert and oriented to person, place, and time. Coordination normal.  Psychiatric: She has a normal mood and affect. Her behavior is normal. Judgment and thought content normal.          Assessment & Plan:

## 2012-11-26 NOTE — Patient Instructions (Addendum)
   Take ibuprofen 600 mg every 6 hours for the next 4 days. Take with food.  Also take muscle relaxer for muscle spasms. This will make you sleepy so only take this at bedtime when you are working.  Apply ice alternating with heat to the affected areas for 15 min at a time. Do this approximately 3-4 times daily.  Use a firm pillow between your knees when you lie on your side or under your knees when you lie on your back.  Return to clinic if your symptoms are not improving within 6 weeks or sooner if your symptoms worsen.  We can refer you to physical therapy. Please let us know.

## 2013-05-05 ENCOUNTER — Other Ambulatory Visit: Payer: Self-pay

## 2015-08-31 ENCOUNTER — Ambulatory Visit (INDEPENDENT_AMBULATORY_CARE_PROVIDER_SITE_OTHER): Payer: BC Managed Care – PPO | Admitting: Internal Medicine

## 2015-08-31 ENCOUNTER — Encounter: Payer: Self-pay | Admitting: Internal Medicine

## 2015-08-31 ENCOUNTER — Telehealth: Payer: Self-pay | Admitting: *Deleted

## 2015-08-31 VITALS — BP 120/80 | HR 82 | Temp 98.3°F | Resp 18 | Ht 62.0 in | Wt 230.0 lb

## 2015-08-31 DIAGNOSIS — F329 Major depressive disorder, single episode, unspecified: Secondary | ICD-10-CM

## 2015-08-31 DIAGNOSIS — F419 Anxiety disorder, unspecified: Secondary | ICD-10-CM

## 2015-08-31 DIAGNOSIS — E7800 Pure hypercholesterolemia, unspecified: Secondary | ICD-10-CM | POA: Diagnosis not present

## 2015-08-31 DIAGNOSIS — I10 Essential (primary) hypertension: Secondary | ICD-10-CM

## 2015-08-31 DIAGNOSIS — F418 Other specified anxiety disorders: Secondary | ICD-10-CM | POA: Diagnosis not present

## 2015-08-31 DIAGNOSIS — R739 Hyperglycemia, unspecified: Secondary | ICD-10-CM | POA: Diagnosis not present

## 2015-08-31 MED ORDER — CITALOPRAM HYDROBROMIDE 10 MG PO TABS: 10.0000 mg | ORAL_TABLET | Freq: Every day | ORAL | Status: DC

## 2015-08-31 NOTE — Progress Notes (Signed)
Patient ID: Caroline Meyers, female   DOB: 05-Apr-1961, 55 y.o.   MRN: [id number]   Subjective:    Patient ID: Caroline Meyers, female    DOB: February 22, 1961, 55 y.o.   MRN: [id number]  HPI  Patient with past history of hypercholesterolemia, hypertension and hyperglycemia.  She comes in today for a scheduled follow up.  I have not seen her since 2014.  She has not followed up regarding her underlying issues.  States her mother passed away two years ago.  Has had increased stress dealing with her passing.  She did not seek counseling.  States she feels she is passed the need for counseling now.  Has good support.  Does feel she needs something to help level things out.  Discussed treatment.  She reports she has had what she feels are panic attacks.  Has had four major episodes.  One occurred at the dentist.  One occurred while she was sitting in the recliner.  Feels increased stress and feels going to jump out of her skin.  No chest pain or tightness with increased activity or exertion.  No nausea or vomiting.  Bowels stable.     Past Medical History  Diagnosis Date  . Environmental allergies   . Hypercholesteremia   . Hypertension   . Sleep apnea   . Achilles tendon tear     age 55  . Hyperglycemia    Past Surgical History  Procedure Laterality Date  . Lumbar microdiskectomy  07/03/09   Family History  Problem Relation Age of Onset  . Alzheimer's disease      grandmother  . Heart disease      myocardial infarction (41s)- grandfather  . Breast cancer Neg Hx   . Colon cancer Neg Hx   . Hypercholesterolemia Mother   . Hypercholesterolemia Sister    Social History   Social History  . Marital Status: Married    Spouse Name: N/A  . Number of Children: 1  . Years of Education: N/A   Occupational History  . teacher    Social History Main Topics  . Smoking status: Never Smoker   . Smokeless tobacco: Never Used  . Alcohol Use: No  . Drug Use: No  . Sexual Activity: Not Asked   Other Topics  Concern  . None   Social History Narrative    Outpatient Encounter Prescriptions as of 08/31/2015  Medication Sig  . acetaminophen (TYLENOL) 325 MG tablet Take 650 mg by mouth as needed for headache.  . citalopram (CELEXA) 10 MG tablet Take 1 tablet (10 mg total) by mouth daily.  . [DISCONTINUED] glucose blood test strip Reported on 08/31/2015  . [DISCONTINUED] methocarbamol (ROBAXIN) 750 MG tablet Take 2 tablets 3 times a day as needed (Patient not taking: Reported on 08/31/2015)  . [DISCONTINUED] oxyCODONE-acetaminophen (PERCOCET) 7.5-325 MG per tablet Take 1 tablet by mouth every 6 (six) hours as needed for pain. Reported on 08/31/2015   No facility-administered encounter medications on file as of 08/31/2015.    Review of Systems  Constitutional: Negative for appetite change and unexpected weight change.  HENT: Negative for congestion and sinus pressure.   Respiratory: Negative for cough, chest tightness and shortness of breath.   Cardiovascular: Negative for chest pain, palpitations and leg swelling.  Gastrointestinal: Negative for nausea, vomiting, abdominal pain and diarrhea.  Genitourinary: Negative for dysuria and difficulty urinating.  Musculoskeletal: Negative for back pain and joint swelling.  Skin: Negative for color change and rash.  Neurological: Negative  for dizziness and light-headedness.  Psychiatric/Behavioral: Negative for dysphoric mood and agitation.       Objective:    Physical Exam  Constitutional: She appears well-developed and well-nourished. No distress.  HENT:  Nose: Nose normal.  Mouth/Throat: Oropharynx is clear and moist.  Eyes: Conjunctivae are normal. Right eye exhibits no discharge. Left eye exhibits no discharge.  Neck: Neck supple. No thyromegaly present.  Cardiovascular: Normal rate and regular rhythm.   Pulmonary/Chest: Breath sounds normal. No respiratory distress. She has no wheezes.  Abdominal: Soft. Bowel sounds are normal. There is no  tenderness.  Musculoskeletal: She exhibits no edema or tenderness.  Lymphadenopathy:    She has no cervical adenopathy.  Skin: No rash noted. No erythema.  Psychiatric: She has a normal mood and affect. Her behavior is normal.    BP 120/80 mmHg  Pulse 82  Temp(Src) 98.3 F (36.8 C) (Oral)  Resp 18  Ht _0  (1.575 m)  Wt 230 lb (104.327 kg)  BMI 42.06 kg/m2  SpO2 97%  LMP 03/09/2012 Wt Readings from Last 3 Encounters:  08/31/15 230 lb (104.327 kg)  11/26/12 227 lb (102.967 kg)  10/13/12 229 lb 8 oz (104.101 kg)     Lab Results  Component Value Date   WBC 7.5 05/26/2012   HGB 14.5 05/26/2012   HCT 43.2 05/26/2012   PLT 388.0 05/26/2012   GLUCOSE 115* 10/07/2012   CHOL 242* 10/07/2012   TRIG 198.0* 10/07/2012   HDL 30.50* 10/07/2012   LDLDIRECT 176.7 10/07/2012   ALT 22 10/07/2012   AST 19 10/07/2012   NA 138 10/07/2012   K 3.9 10/07/2012   CL 103 10/07/2012   CREATININE 0.5 10/07/2012   BUN 15 10/07/2012   CO2 26 10/07/2012   TSH 2.63 05/26/2012   HGBA1C 6.7* 10/07/2012   MICROALBUR 4.9* 10/07/2012       Assessment & Plan:   Problem List Items Addressed This Visit    Anxiety and depression    Increased anxiety and possible anxiety attacks.  Some reactive depression with her mother's passing.  Discussed with her today.  Discussed treatment options.  Start citalopram 54m q day.  Follow closely.  Get her back in soon to reassess.        Hypercholesterolemia    Low cholesterol diet and exercise.  Follow lipid panel.        Relevant Orders   Lipid panel   Hepatic function panel   Hyperglycemia    Low carb diet and exercise.  Follow met b and a1c.  Discussed diet and exercise.        Relevant Orders   Hemoglobin AU5K  Basic metabolic panel   Microalbumin / creatinine urine ratio   Hypertension - Primary    Blood pressure under good control.  Continue same medication regimen.  Follow pressures.  Follow metabolic panel.        Relevant Orders   CBC  with Differential/Platelet   TSH       SEinar Pheasant MD

## 2015-08-31 NOTE — Telephone Encounter (Signed)
Correction from the previous note: patient will need a place on Dr. Bary Leriche schedule in 6 weeks for a 30 min follow up. Please advise

## 2015-08-31 NOTE — Telephone Encounter (Signed)
Patient will need a place on Dr. Bary Leriche schedule in 4 week for a 30 min follow up. Please advise

## 2015-08-31 NOTE — Progress Notes (Signed)
Pre-visit discussion using our clinic review tool. No additional management support is needed unless otherwise documented below in the visit note.  

## 2015-09-01 ENCOUNTER — Encounter: Payer: Self-pay | Admitting: Internal Medicine

## 2015-09-01 DIAGNOSIS — F419 Anxiety disorder, unspecified: Secondary | ICD-10-CM

## 2015-09-01 DIAGNOSIS — F32A Depression, unspecified: Secondary | ICD-10-CM | POA: Insufficient documentation

## 2015-09-01 DIAGNOSIS — F329 Major depressive disorder, single episode, unspecified: Secondary | ICD-10-CM | POA: Insufficient documentation

## 2015-09-01 NOTE — Assessment & Plan Note (Signed)
Blood pressure under good control.  Continue same medication regimen.  Follow pressures.  Follow metabolic panel.   

## 2015-09-01 NOTE — Assessment & Plan Note (Signed)
Low carb diet and exercise.  Follow met b and a1c.  Discussed diet and exercise.   

## 2015-09-01 NOTE — Assessment & Plan Note (Signed)
Increased anxiety and possible anxiety attacks.  Some reactive depression with her mother's passing.  Discussed with her today.  Discussed treatment options.  Start citalopram 10mg  q day.  Follow closely.  Get her back in soon to reassess.

## 2015-09-01 NOTE — Assessment & Plan Note (Signed)
Low cholesterol diet and exercise.  Follow lipid panel.   

## 2015-09-06 ENCOUNTER — Other Ambulatory Visit: Payer: Self-pay

## 2015-09-06 NOTE — Telephone Encounter (Signed)
Please confirm with pharmacy that she has had filled previously and who has been prescribing.  I do not mind refilling, just need more information.

## 2015-09-06 NOTE — Telephone Encounter (Signed)
Pt is requesting a refill on her epi-pen. Please advise, thanks

## 2015-09-07 ENCOUNTER — Other Ambulatory Visit: Payer: BC Managed Care – PPO

## 2015-09-07 MED ORDER — EPINEPHRINE 0.3 MG/0.3ML IJ SOAJ: 0.3000 mg | Freq: Once | INTRAMUSCULAR | Status: DC

## 2015-09-07 NOTE — Telephone Encounter (Signed)
[id number]

## 2015-09-07 NOTE — Telephone Encounter (Signed)
Ok.  Sent in rx to [facility].  Please notify pt.  Thanks

## 2015-09-07 NOTE — Telephone Encounter (Signed)
Notified pt. 

## 2015-09-07 NOTE — Telephone Encounter (Signed)
Pt states that Dr.[name] prescribed it back in 2014 when you worked with [facility] she was given the prescription for shellfish allergy. Pt states that she had several episodes of problems with her throat closing up, not being able to breath and lip swelling up. Pt has never formally seen an allergist. Pt has been getting her epi pen refilled [facility].

## 2015-10-08 ENCOUNTER — Telehealth: Payer: Self-pay | Admitting: Internal Medicine

## 2015-10-08 NOTE — Telephone Encounter (Signed)
Pt called to let us know  that she stopped taking the citalopram (CELEXA) 10 MG tablet due to it causing sever headaches.. Please advise pt with any questions.

## 2015-10-08 NOTE — Telephone Encounter (Signed)
Ok.  Let me know if feels needs anything.

## 2015-10-08 NOTE — Telephone Encounter (Signed)
Spoke with the patient.  She took the medication for 9 days and had a severe headache every day.  As soon as she discontinued the medication the headaches went away completely.  Patient wanted you to know that she doesn't believe that she needs a replacement either.  Thanks Please advise.

## 2015-10-10 ENCOUNTER — Ambulatory Visit: Payer: BC Managed Care – PPO | Admitting: Internal Medicine

## 2015-10-18 ENCOUNTER — Other Ambulatory Visit (INDEPENDENT_AMBULATORY_CARE_PROVIDER_SITE_OTHER): Payer: BC Managed Care – PPO

## 2015-10-18 DIAGNOSIS — I10 Essential (primary) hypertension: Secondary | ICD-10-CM

## 2015-10-18 DIAGNOSIS — R739 Hyperglycemia, unspecified: Secondary | ICD-10-CM | POA: Diagnosis not present

## 2015-10-18 DIAGNOSIS — E7800 Pure hypercholesterolemia, unspecified: Secondary | ICD-10-CM

## 2015-10-18 LAB — BASIC METABOLIC PANEL
BUN: 14 mg/dL (ref 6–23)
CALCIUM: 9.7 mg/dL (ref 8.4–10.5)
CHLORIDE: 101 meq/L (ref 96–112)
CO2: 29 meq/L (ref 19–32)
Creatinine, Ser: 0.53 mg/dL (ref 0.40–1.20)
GFR: 127.71 mL/min (ref >60.00)
Glucose, Bld: 177 mg/dL — ABNORMAL HIGH (ref 70–99)
POTASSIUM: 4 meq/L (ref 3.5–5.1)
SODIUM: 137 meq/L (ref 135–145)

## 2015-10-18 LAB — HEPATIC FUNCTION PANEL
ALT: 24 U/L (ref 0–35)
AST: 20 U/L (ref 0–37)
Albumin: 4.3 g/dL (ref 3.5–5.2)
Alkaline Phosphatase: 61 U/L (ref 39–117)
BILIRUBIN DIRECT: 0.1 mg/dL (ref 0.0–0.3)
BILIRUBIN TOTAL: 0.8 mg/dL (ref 0.2–1.2)
Total Protein: 7.4 g/dL (ref 6.0–8.3)

## 2015-10-18 LAB — CBC WITH DIFFERENTIAL/PLATELET
BASOS PCT: 0.8 % (ref 0.0–3.0)
Basophils Absolute: 0.1 10*3/uL (ref 0.0–0.1)
EOS PCT: 1.9 % (ref 0.0–5.0)
Eosinophils Absolute: 0.1 10*3/uL (ref 0.0–0.7)
HEMATOCRIT: 44.5 % (ref 36.0–46.0)
HEMOGLOBIN: 15 g/dL (ref 12.0–15.0)
LYMPHS PCT: 34.1 % (ref 12.0–46.0)
Lymphs Abs: 2.6 10*3/uL (ref 0.7–4.0)
MCHC: 33.8 g/dL (ref 30.0–36.0)
MCV: 90.3 fl (ref 78.0–100.0)
Monocytes Absolute: 0.4 10*3/uL (ref 0.1–1.0)
Monocytes Relative: 4.8 % (ref 3.0–12.0)
NEUTROS ABS: 4.5 10*3/uL (ref 1.4–7.7)
Neutrophils Relative %: 58.4 % (ref 43.0–77.0)
Platelets: 385 10*3/uL (ref 150.0–400.0)
RBC: 4.93 Mil/uL (ref 3.87–5.11)
RDW: 13.3 % (ref 11.5–15.5)
WBC: 7.7 10*3/uL (ref 4.0–10.5)

## 2015-10-18 LAB — LIPID PANEL
CHOL/HDL RATIO: 7
Cholesterol: 230 mg/dL — ABNORMAL HIGH (ref 0–200)
HDL: 33.8 mg/dL — ABNORMAL LOW (ref >39.00)
LDL Cholesterol: 158 mg/dL — ABNORMAL HIGH (ref 0–99)
NONHDL: 196.02
Triglycerides: 188 mg/dL — ABNORMAL HIGH (ref 0.0–149.0)
VLDL: 37.6 mg/dL (ref 0.0–40.0)

## 2015-10-18 LAB — TSH: TSH: 3.87 u[IU]/mL (ref 0.35–4.50)

## 2015-10-18 LAB — MICROALBUMIN / CREATININE URINE RATIO
CREATININE, U: 199.7 mg/dL
MICROALB/CREAT RATIO: 3 mg/g (ref 0.0–30.0)
Microalb, Ur: 6 mg/dL — ABNORMAL HIGH (ref 0.0–1.9)

## 2015-10-18 LAB — HEMOGLOBIN A1C: HEMOGLOBIN A1C: 9.1 % — AB (ref 4.6–6.5)

## 2015-10-22 ENCOUNTER — Encounter: Payer: Self-pay | Admitting: *Deleted

## 2015-10-25 NOTE — Telephone Encounter (Signed)
Unread mychart message mailed to patient 

## 2015-10-29 MED ORDER — ROSUVASTATIN CALCIUM 5 MG PO TABS: 5.0000 mg | ORAL_TABLET | Freq: Every day | ORAL | Status: DC

## 2015-10-29 NOTE — Addendum Note (Signed)
Addended by: Alisa Graff on: 10/29/2015 11:32 AM   Modules accepted: Orders

## 2015-10-29 NOTE — Telephone Encounter (Signed)
If she is able to come on 11/07/15, I would like to see her to discuss her labs.  Will send in crestor.  Can schedule f/u lab at her appt.

## 2015-11-07 ENCOUNTER — Encounter: Payer: Self-pay | Admitting: Internal Medicine

## 2015-11-07 ENCOUNTER — Ambulatory Visit: Payer: BC Managed Care – PPO | Admitting: Internal Medicine

## 2015-11-07 ENCOUNTER — Ambulatory Visit (INDEPENDENT_AMBULATORY_CARE_PROVIDER_SITE_OTHER): Payer: BC Managed Care – PPO | Admitting: Internal Medicine

## 2015-11-07 VITALS — BP 118/70 | HR 74 | Temp 98.4°F | Resp 18 | Ht 62.0 in | Wt 225.5 lb

## 2015-11-07 DIAGNOSIS — F419 Anxiety disorder, unspecified: Secondary | ICD-10-CM

## 2015-11-07 DIAGNOSIS — F32A Depression, unspecified: Secondary | ICD-10-CM

## 2015-11-07 DIAGNOSIS — E7800 Pure hypercholesterolemia, unspecified: Secondary | ICD-10-CM

## 2015-11-07 DIAGNOSIS — F418 Other specified anxiety disorders: Secondary | ICD-10-CM | POA: Diagnosis not present

## 2015-11-07 DIAGNOSIS — I10 Essential (primary) hypertension: Secondary | ICD-10-CM

## 2015-11-07 DIAGNOSIS — F329 Major depressive disorder, single episode, unspecified: Secondary | ICD-10-CM

## 2015-11-07 DIAGNOSIS — E119 Type 2 diabetes mellitus without complications: Secondary | ICD-10-CM

## 2015-11-07 MED ORDER — METFORMIN HCL 500 MG PO TABS: 500.0000 mg | ORAL_TABLET | Freq: Two times a day (BID) | ORAL | Status: DC

## 2015-11-07 NOTE — Progress Notes (Signed)
Pre-visit discussion using our clinic review tool. No additional management support is needed unless otherwise documented below in the visit note.  

## 2015-11-07 NOTE — Progress Notes (Signed)
Patient ID: Caroline Meyers, female   DOB: 25-Jun-1961, 55 y.o.   MRN: [id number]   Subjective:    Patient ID: Caroline Meyers, female    DOB: 08/26/1960, 55 y.o.   MRN: [id number]  HPI  Patient here for a scheduled follow up.  Her recent a1c was 9.1.  Cholesterol elevated.  She is taking metformin.  Also started on crestor.  Tolerating.  Not taking citalopram.  Did not tolerate.  Feels better.  No chest pain.  No sob.  No abdominal pain or cramping.  Bowels stable.  Sugars attached.  Discussed diet and exercise.     Past Medical History  Diagnosis Date  . Environmental allergies   . Hypercholesteremia   . Hypertension   . Sleep apnea   . Achilles tendon tear     age 62  . Hyperglycemia    Past Surgical History  Procedure Laterality Date  . Lumbar microdiskectomy  07/03/09   Family History  Problem Relation Age of Onset  . Alzheimer's disease      grandmother  . Heart disease      myocardial infarction (105s)- grandfather  . Breast cancer Neg Hx   . Colon cancer Neg Hx   . Hypercholesterolemia Mother   . Hypercholesterolemia Sister    Social History   Social History  . Marital Status: Married    Spouse Name: N/A  . Number of Children: 1  . Years of Education: N/A   Occupational History  . teacher    Social History Main Topics  . Smoking status: Never Smoker   . Smokeless tobacco: Never Used  . Alcohol Use: No  . Drug Use: No  . Sexual Activity: Not Asked   Other Topics Concern  . None   Social History Narrative    Outpatient Encounter Prescriptions as of 11/07/2015  Medication Sig  . acetaminophen (TYLENOL) 325 MG tablet Take 650 mg by mouth as needed for headache.  Marland Kitchen EPINEPHrine (EPIPEN 2-PAK) 0.3 mg/0.3 mL IJ SOAJ injection Inject 0.3 mLs (0.3 mg total) into the muscle once.  . rosuvastatin (CRESTOR) 5 MG tablet Take 1 tablet (5 mg total) by mouth daily.  . [DISCONTINUED] citalopram (CELEXA) 10 MG tablet Take 1 tablet (10 mg total) by mouth daily.  . metFORMIN  (GLUCOPHAGE) 500 MG tablet Take 1 tablet (500 mg total) by mouth 2 (two) times daily with a meal.  . [DISCONTINUED] metFORMIN (GLUCOPHAGE) 500 MG tablet Take 1 tablet (500 mg total) by mouth 2 (two) times daily with a meal.   No facility-administered encounter medications on file as of 11/07/2015.    Review of Systems  Constitutional: Negative for appetite change and unexpected weight change.  HENT: Negative for congestion and sinus pressure.   Respiratory: Negative for cough, chest tightness and shortness of breath.   Cardiovascular: Negative for chest pain, palpitations and leg swelling.  Gastrointestinal: Negative for nausea, vomiting, abdominal pain and diarrhea.  Genitourinary: Negative for dysuria and difficulty urinating.  Musculoskeletal: Negative for back pain and joint swelling.  Skin: Negative for color change and rash.  Neurological: Negative for dizziness, light-headedness and headaches.  Psychiatric/Behavioral: Negative for dysphoric mood and agitation.       Objective:    Physical Exam  Constitutional: She appears well-developed and well-nourished. No distress.  HENT:  Nose: Nose normal.  Mouth/Throat: Oropharynx is clear and moist.  Neck: Neck supple. No thyromegaly present.  Cardiovascular: Normal rate and regular rhythm.   Pulmonary/Chest: Breath sounds normal. No respiratory  distress. She has no wheezes.  Abdominal: Soft. Bowel sounds are normal. There is no tenderness.  Musculoskeletal: She exhibits no edema or tenderness.  Lymphadenopathy:    She has no cervical adenopathy.  Skin: No rash noted. No erythema.  Psychiatric: She has a normal mood and affect. Her behavior is normal.    BP 118/70 mmHg  Pulse 74  Temp(Src) 98.4 F (36.9 C) (Oral)  Resp 18  Ht 5\' 2"  (1.575 m)  Wt 225 lb 8 oz (102.286 kg)  BMI 41.23 kg/m2  SpO2 96%  LMP 03/09/2012 Wt Readings from Last 3 Encounters:  11/07/15 225 lb 8 oz (102.286 kg)  08/31/15 230 lb (104.327 kg)    11/26/12 227 lb (102.967 kg)     Lab Results  Component Value Date   WBC 7.7 10/18/2015   HGB 15.0 10/18/2015   HCT 44.5 10/18/2015   PLT 385.0 10/18/2015   GLUCOSE 177* 10/18/2015   CHOL 230* 10/18/2015   TRIG 188.0* 10/18/2015   HDL 33.80* 10/18/2015   LDLDIRECT 176.7 10/07/2012   LDLCALC 158* 10/18/2015   ALT 24 10/18/2015   AST 20 10/18/2015   NA 137 10/18/2015   K 4.0 10/18/2015   CL 101 10/18/2015   CREATININE 0.53 10/18/2015   BUN 14 10/18/2015   CO2 29 10/18/2015   TSH 3.87 10/18/2015   HGBA1C 9.1* 10/18/2015   MICROALBUR 6.0* 10/18/2015       Assessment & Plan:   Problem List Items Addressed This Visit    Anxiety and depression    Doing better.  Off citalopram.  Follow.        Diabetes mellitus ([place])    Low carb diet and exercise.  On metformin.  Follow sugars.  Send in readings over the next few weeks.  Follow.  Needs regular eye exams.        Relevant Medications   metFORMIN (GLUCOPHAGE) 500 MG tablet   Hypercholesterolemia - Primary    On crestor now.  Tolerating.  Low cholesterol diet and exercise.  Follow lipid panel and liver function tests.       Relevant Orders   Hepatic function panel   Hypertension    Blood pressure under good control.  Continue same medication regimen.  Follow pressures.  Follow metabolic panel.             [name], MD

## 2015-11-24 ENCOUNTER — Encounter: Payer: Self-pay | Admitting: Internal Medicine

## 2015-11-24 NOTE — Assessment & Plan Note (Signed)
Low carb diet and exercise.  On metformin.  Follow sugars.  Send in readings over the next few weeks.  Follow.  Needs regular eye exams.

## 2015-11-24 NOTE — Assessment & Plan Note (Signed)
Blood pressure under good control.  Continue same medication regimen.  Follow pressures.  Follow metabolic panel.   

## 2015-11-24 NOTE — Assessment & Plan Note (Signed)
Doing better.  Off citalopram.  Follow.

## 2015-11-24 NOTE — Assessment & Plan Note (Signed)
On crestor now.  Tolerating.  Low cholesterol diet and exercise.  Follow lipid panel and liver function tests.   

## 2015-12-05 ENCOUNTER — Other Ambulatory Visit (INDEPENDENT_AMBULATORY_CARE_PROVIDER_SITE_OTHER): Payer: BC Managed Care – PPO

## 2015-12-05 DIAGNOSIS — E7800 Pure hypercholesterolemia, unspecified: Secondary | ICD-10-CM | POA: Diagnosis not present

## 2015-12-06 LAB — HEPATIC FUNCTION PANEL
ALBUMIN: 4.4 g/dL (ref 3.5–5.2)
ALK PHOS: 57 U/L (ref 39–117)
ALT: 16 U/L (ref 0–35)
AST: 14 U/L (ref 0–37)
Bilirubin, Direct: 0.1 mg/dL (ref 0.0–0.3)
TOTAL PROTEIN: 6.9 g/dL (ref 6.0–8.3)
Total Bilirubin: 0.6 mg/dL (ref 0.2–1.2)

## 2015-12-07 ENCOUNTER — Encounter: Payer: Self-pay | Admitting: Internal Medicine

## 2015-12-07 ENCOUNTER — Other Ambulatory Visit: Payer: Self-pay | Admitting: Internal Medicine

## 2015-12-13 NOTE — Telephone Encounter (Signed)
Unread mychart message mailed to patient 

## 2015-12-26 ENCOUNTER — Encounter: Payer: Self-pay | Admitting: *Deleted

## 2015-12-31 NOTE — Telephone Encounter (Signed)
Unread mychart message mailed to patient 

## 2016-01-03 ENCOUNTER — Ambulatory Visit (INDEPENDENT_AMBULATORY_CARE_PROVIDER_SITE_OTHER): Payer: BC Managed Care – PPO | Admitting: Internal Medicine

## 2016-01-03 ENCOUNTER — Encounter: Payer: Self-pay | Admitting: Internal Medicine

## 2016-01-03 VITALS — BP 120/80 | HR 79 | Temp 98.4°F | Resp 18 | Ht 62.0 in | Wt 221.2 lb

## 2016-01-03 DIAGNOSIS — F329 Major depressive disorder, single episode, unspecified: Secondary | ICD-10-CM

## 2016-01-03 DIAGNOSIS — E119 Type 2 diabetes mellitus without complications: Secondary | ICD-10-CM

## 2016-01-03 DIAGNOSIS — F418 Other specified anxiety disorders: Secondary | ICD-10-CM

## 2016-01-03 DIAGNOSIS — E7800 Pure hypercholesterolemia, unspecified: Secondary | ICD-10-CM | POA: Diagnosis not present

## 2016-01-03 DIAGNOSIS — F419 Anxiety disorder, unspecified: Secondary | ICD-10-CM

## 2016-01-03 DIAGNOSIS — I10 Essential (primary) hypertension: Secondary | ICD-10-CM

## 2016-01-03 DIAGNOSIS — F32A Depression, unspecified: Secondary | ICD-10-CM

## 2016-01-03 NOTE — Progress Notes (Signed)
Patient ID: Caroline Meyers, female   DOB: [birth year], 55 y.o.   MRN: [id number]   Subjective:    Patient ID: Caroline Meyers, female    DOB: 09-16-1960, 55 y.o.   MRN: [id number]  HPI  Patient here for a scheduled follow up.  States she has been doing relatively well.  AM sugars averaging mostly 140-170 recently.  One pm sugar 197.  Not taking her medication regularly.  Discussed diet and exercise.  She has changed dome things and is planning to do better.  Trying to stay active.  Discussed diet and exercise.  No chest pain.  No sob.  No acid reflux.  No abdominal pain or cramping.  Bowels stable.     Past Medical History  Diagnosis Date  . Environmental allergies   . Hypercholesteremia   . Hypertension   . Sleep apnea   . Achilles tendon tear     age 66  . Hyperglycemia    Past Surgical History  Procedure Laterality Date  . Lumbar microdiskectomy  07/03/09   Family History  Problem Relation Age of Onset  . Alzheimer's disease      grandmother  . Heart disease      myocardial infarction (80s)- grandfather  . Breast cancer Neg Hx   . Colon cancer Neg Hx   . Hypercholesterolemia Mother   . Hypercholesterolemia Sister    Social History   Social History  . Marital Status: Married    Spouse Name: N/A  . Number of Children: 1  . Years of Education: N/A   Occupational History  . teacher    Social History Main Topics  . Smoking status: Never Smoker   . Smokeless tobacco: Never Used  . Alcohol Use: No  . Drug Use: No  . Sexual Activity: Not Asked   Other Topics Concern  . None   Social History Narrative    Outpatient Encounter Prescriptions as of 01/03/2016  Medication Sig  . acetaminophen (TYLENOL) 325 MG tablet Take 650 mg by mouth as needed for headache.  Marland Kitchen EPINEPHrine (EPIPEN 2-PAK) 0.3 mg/0.3 mL IJ SOAJ injection Inject 0.3 mLs (0.3 mg total) into the muscle once.  . metFORMIN (GLUCOPHAGE) 500 MG tablet Take 1 tablet (500 mg total) by mouth 2 (two) times daily with a  meal.  . ONETOUCH VERIO test strip CHECK BLOOD SUGAR TWICE DAILY  . rosuvastatin (CRESTOR) 5 MG tablet TAKE 1 TABLET (5 MG TOTAL) BY MOUTH DAILY.   No facility-administered encounter medications on file as of 01/03/2016.    Review of Systems  Constitutional: Negative for appetite change and unexpected weight change.  HENT: Negative for congestion and sinus pressure.   Respiratory: Negative for cough, chest tightness and shortness of breath.   Cardiovascular: Negative for chest pain, palpitations and leg swelling.  Gastrointestinal: Negative for nausea, vomiting, abdominal pain and diarrhea.  Genitourinary: Negative for dysuria and difficulty urinating.  Musculoskeletal: Negative for back pain and joint swelling.  Skin: Negative for color change and rash.  Neurological: Negative for dizziness, light-headedness and headaches.  Psychiatric/Behavioral: Negative for dysphoric mood and agitation.       Objective:    Physical Exam  Constitutional: She appears well-developed and well-nourished. No distress.  HENT:  Nose: Nose normal.  Mouth/Throat: Oropharynx is clear and moist.  Neck: Neck supple. No thyromegaly present.  Cardiovascular: Normal rate and regular rhythm.   Pulmonary/Chest: Breath sounds normal. No respiratory distress. She has no wheezes.  Abdominal: Soft. Bowel sounds are normal.  There is no tenderness.  Musculoskeletal: She exhibits no edema or tenderness.  Lymphadenopathy:    She has no cervical adenopathy.  Skin: No rash noted. No erythema.  Psychiatric: She has a normal mood and affect. Her behavior is normal.    BP 120/80 mmHg  Pulse 79  Temp(Src) 98.4 F (36.9 C) (Oral)  Resp 18  Ht '5\' 2"'  (1.575 m)  Wt 221 lb 4 oz (100.358 kg)  BMI 40.46 kg/m2  SpO2 96%  LMP 03/09/2012 Wt Readings from Last 3 Encounters:  01/03/16 221 lb 4 oz (100.358 kg)  11/07/15 225 lb 8 oz (102.286 kg)  08/31/15 230 lb (104.327 kg)     Lab Results  Component Value Date   WBC  7.7 10/18/2015   HGB 15.0 10/18/2015   HCT 44.5 10/18/2015   PLT 385.0 10/18/2015   GLUCOSE 177* 10/18/2015   CHOL 230* 10/18/2015   TRIG 188.0* 10/18/2015   HDL 33.80* 10/18/2015   LDLDIRECT 176.7 10/07/2012   LDLCALC 158* 10/18/2015   ALT 16 12/05/2015   AST 14 12/05/2015   NA 137 10/18/2015   K 4.0 10/18/2015   CL 101 10/18/2015   CREATININE 0.53 10/18/2015   BUN 14 10/18/2015   CO2 29 10/18/2015   TSH 3.87 10/18/2015   HGBA1C 9.1* 10/18/2015   MICROALBUR 6.0* 10/18/2015       Assessment & Plan:   Problem List Items Addressed This Visit    Anxiety and depression    Doing better.  Not on citalopram.  Follow.       Diabetes mellitus ([place]) - Primary    Discussed diet and exercise.  Sugars as outlined.  Follow sugars.  Need to take metformin bid on a regular basis.  Follow met b and a1c.       Relevant Orders   Hemoglobin A1c   Hypercholesterolemia    On crestor.  Follow lipid panel and liver function tests.  Low cholesterol diet and exercise.        Relevant Orders   Lipid panel   Hepatic function panel   Hypertension    Blood pressure under good control.  Continue same medication regimen.  Follow pressures.  Follow metabolic panel.        Relevant Orders   Basic metabolic panel       [name], MD

## 2016-01-03 NOTE — Progress Notes (Signed)
Pre-visit discussion using our clinic review tool. No additional management support is needed unless otherwise documented below in the visit note.  

## 2016-01-06 ENCOUNTER — Encounter: Payer: Self-pay | Admitting: Internal Medicine

## 2016-01-06 NOTE — Assessment & Plan Note (Signed)
Doing better.  Not on citalopram.  Follow.

## 2016-01-06 NOTE — Assessment & Plan Note (Signed)
Blood pressure under good control.  Continue same medication regimen.  Follow pressures.  Follow metabolic panel.   

## 2016-01-06 NOTE — Assessment & Plan Note (Signed)
Discussed diet and exercise.  Sugars as outlined.  Follow sugars.  Need to take metformin bid on a regular basis.  Follow met b and a1c.

## 2016-01-06 NOTE — Assessment & Plan Note (Signed)
On crestor. Follow lipid panel and liver function tests.  Low cholesterol diet and exercise.  

## 2016-02-13 ENCOUNTER — Encounter: Payer: Self-pay | Admitting: Internal Medicine

## 2016-02-13 NOTE — Telephone Encounter (Signed)
Attached are the pt's BS readings. Ignore the message I sent back to pt. Pt is aware we are able to view document.

## 2016-03-18 ENCOUNTER — Other Ambulatory Visit (INDEPENDENT_AMBULATORY_CARE_PROVIDER_SITE_OTHER): Payer: BC Managed Care – PPO

## 2016-03-18 ENCOUNTER — Encounter: Payer: Self-pay | Admitting: Internal Medicine

## 2016-03-18 DIAGNOSIS — E119 Type 2 diabetes mellitus without complications: Secondary | ICD-10-CM | POA: Diagnosis not present

## 2016-03-18 DIAGNOSIS — I10 Essential (primary) hypertension: Secondary | ICD-10-CM

## 2016-03-18 DIAGNOSIS — E7800 Pure hypercholesterolemia, unspecified: Secondary | ICD-10-CM

## 2016-03-18 LAB — BASIC METABOLIC PANEL
BUN: 14 mg/dL (ref 6–23)
CHLORIDE: 103 meq/L (ref 96–112)
CO2: 29 meq/L (ref 19–32)
Calcium: 9.2 mg/dL (ref 8.4–10.5)
Creatinine, Ser: 0.56 mg/dL (ref 0.40–1.20)
GFR: 119.66 mL/min (ref >60.00)
GLUCOSE: 118 mg/dL — AB (ref 70–99)
POTASSIUM: 3.9 meq/L (ref 3.5–5.1)
SODIUM: 138 meq/L (ref 135–145)

## 2016-03-18 LAB — LIPID PANEL
CHOLESTEROL: 129 mg/dL (ref 0–200)
HDL: 35.4 mg/dL — ABNORMAL LOW (ref >39.00)
LDL CALC: 68 mg/dL (ref 0–99)
NonHDL: 93.36
TRIGLYCERIDES: 126 mg/dL (ref 0.0–149.0)
Total CHOL/HDL Ratio: 4
VLDL: 25.2 mg/dL (ref 0.0–40.0)

## 2016-03-18 LAB — HEPATIC FUNCTION PANEL
ALT: 16 U/L (ref 0–35)
AST: 13 U/L (ref 0–37)
Albumin: 4.3 g/dL (ref 3.5–5.2)
Alkaline Phosphatase: 48 U/L (ref 39–117)
BILIRUBIN DIRECT: 0 mg/dL (ref 0.0–0.3)
BILIRUBIN TOTAL: 0.7 mg/dL (ref 0.2–1.2)
Total Protein: 7.4 g/dL (ref 6.0–8.3)

## 2016-03-18 LAB — HEMOGLOBIN A1C: Hgb A1c MFr Bld: 7.1 % — ABNORMAL HIGH (ref 4.6–6.5)

## 2016-03-27 ENCOUNTER — Ambulatory Visit (INDEPENDENT_AMBULATORY_CARE_PROVIDER_SITE_OTHER): Payer: BC Managed Care – PPO | Admitting: Internal Medicine

## 2016-03-27 ENCOUNTER — Encounter: Payer: Self-pay | Admitting: Internal Medicine

## 2016-03-27 VITALS — BP 104/62 | HR 80 | Temp 99.0°F | Ht 62.0 in | Wt 218.0 lb

## 2016-03-27 DIAGNOSIS — E119 Type 2 diabetes mellitus without complications: Secondary | ICD-10-CM | POA: Diagnosis not present

## 2016-03-27 DIAGNOSIS — Z1239 Encounter for other screening for malignant neoplasm of breast: Secondary | ICD-10-CM | POA: Diagnosis not present

## 2016-03-27 DIAGNOSIS — E7800 Pure hypercholesterolemia, unspecified: Secondary | ICD-10-CM

## 2016-03-27 DIAGNOSIS — I10 Essential (primary) hypertension: Secondary | ICD-10-CM | POA: Diagnosis not present

## 2016-03-27 MED ORDER — METFORMIN HCL ER 500 MG PO TB24: ORAL_TABLET | ORAL | 3 refills | Status: DC

## 2016-03-27 NOTE — Progress Notes (Signed)
Patient ID: Domenic Schwab, female   DOB: [birth year], 55 y.o.   MRN: [id number]   Subjective:    Patient ID: Domenic Schwab, female    DOB: Jan 26, 1961, 55 y.o.   MRN: [id number]  HPI  Patient here for a scheduled follow up.  She has adjusted her diet.  Lost weight.  Sugars improved.  Cholesterol improved.  She does feel better.  No chest pain.  No sob. No acid reflux.  No abdominal pain or cramping.  Bowels stable.  Reviewed labs.     Past Medical History:  Diagnosis Date  . Achilles tendon tear    age 11  . Environmental allergies   . Hypercholesteremia   . Hyperglycemia   . Hypertension   . Sleep apnea    Past Surgical History:  Procedure Laterality Date  . lumbar microdiskectomy  07/03/09   Family History  Problem Relation Age of Onset  . Alzheimer's disease      grandmother  . Heart disease      myocardial infarction (75s)- grandfather  . Hypercholesterolemia Mother   . Hypercholesterolemia Sister   . Breast cancer Neg Hx   . Colon cancer Neg Hx    Social History   Social History  . Marital status: Married    Spouse name: N/A  . Number of children: 1  . Years of education: N/A   Occupational History  . teacher [employer]   Social History Main Topics  . Smoking status: Never Smoker  . Smokeless tobacco: Never Used  . Alcohol use No  . Drug use:     Types: Hydrocodone  . Sexual activity: Not Asked   Other Topics Concern  . None   Social History Narrative  . None    Outpatient Encounter Prescriptions as of 03/27/2016  Medication Sig  . acetaminophen (TYLENOL) 325 MG tablet Take 650 mg by mouth as needed for headache.  Marland Kitchen EPINEPHrine (EPIPEN 2-PAK) 0.3 mg/0.3 mL IJ SOAJ injection Inject 0.3 mLs (0.3 mg total) into the muscle once.  Glory Rosebush VERIO test strip CHECK BLOOD SUGAR TWICE DAILY  . rosuvastatin (CRESTOR) 5 MG tablet TAKE 1 TABLET (5 MG TOTAL) BY MOUTH DAILY.  . [DISCONTINUED] metFORMIN (GLUCOPHAGE) 500 MG tablet Take 1 tablet  (500 mg total) by mouth 2 (two) times daily with a meal.  . metFORMIN (GLUCOPHAGE-XR) 500 MG 24 hr tablet Take 2 tablets bid   No facility-administered encounter medications on file as of 03/27/2016.     Review of Systems  Constitutional: Negative for appetite change and unexpected weight change.  HENT: Negative for congestion and sinus pressure.   Respiratory: Negative for cough, chest tightness and shortness of breath.   Cardiovascular: Negative for chest pain, palpitations and leg swelling.  Gastrointestinal: Negative for abdominal pain, diarrhea, nausea and vomiting.  Genitourinary: Negative for difficulty urinating and dysuria.  Musculoskeletal: Negative for back pain and joint swelling.  Skin: Negative for color change and rash.  Neurological: Negative for dizziness, light-headedness and headaches.  Psychiatric/Behavioral: Negative for agitation and dysphoric mood.       Objective:    Physical Exam  Constitutional: She appears well-developed and well-nourished. No distress.  HENT:  Nose: Nose normal.  Mouth/Throat: Oropharynx is clear and moist.  Neck: Neck supple. No thyromegaly present.  Cardiovascular: Normal rate and regular rhythm.   Pulmonary/Chest: Breath sounds normal. No respiratory distress. She has no wheezes.  Abdominal: Soft. Bowel sounds are normal. There is no tenderness.  Musculoskeletal: She  exhibits no edema or tenderness.  Lymphadenopathy:    She has no cervical adenopathy.  Skin: No rash noted. No erythema.  Psychiatric: She has a normal mood and affect. Her behavior is normal.    BP 104/62   Pulse 80   Temp 99 F (37.2 C) (Oral)   Ht _0  (1.575 m)   Wt 218 lb (98.9 kg)   LMP 03/09/2012   SpO2 94%   BMI 39.87 kg/m  Wt Readings from Last 3 Encounters:  03/27/16 218 lb (98.9 kg)  01/03/16 221 lb 4 oz (100.4 kg)  11/07/15 225 lb 8 oz (102.3 kg)     Lab Results  Component Value Date   WBC 7.7 10/18/2015   HGB 15.0 10/18/2015   HCT 44.5  10/18/2015   PLT 385.0 10/18/2015   GLUCOSE 118 (H) 03/18/2016   CHOL 129 03/18/2016   TRIG 126.0 03/18/2016   HDL 35.40 (L) 03/18/2016   LDLDIRECT 176.7 10/07/2012   LDLCALC 68 03/18/2016   ALT 16 03/18/2016   AST 13 03/18/2016   NA 138 03/18/2016   K 3.9 03/18/2016   CL 103 03/18/2016   CREATININE 0.56 03/18/2016   BUN 14 03/18/2016   CO2 29 03/18/2016   TSH 3.87 10/18/2015   HGBA1C 7.1 (H) 03/18/2016   MICROALBUR 6.0 (H) 10/18/2015       Assessment & Plan:   Problem List Items Addressed This Visit    Diabetes mellitus ([place])    Recent a1c improved - 7.1.  Low carb diet and exercise.  Follow met b and a1c.        Relevant Medications   metFORMIN (GLUCOPHAGE-XR) 500 MG 24 hr tablet   Other Relevant Orders   Hemoglobin F0Y   Basic metabolic panel   Hypercholesterolemia    Low cholesterol diet and exercise.  On crestor.  Follow lipid panel and liver function tests.        Relevant Orders   Lipid panel   Hepatic function panel   Hypertension    Blood pressure under good control.  Continue same medication regimen.  Follow pressures.  Follow metabolic panel.         Other Visit Diagnoses    Breast cancer screening    -  Primary   Relevant Orders   MM DIGITAL SCREENING BILATERAL       [name], MD

## 2016-03-27 NOTE — Progress Notes (Signed)
Pre visit review using our clinic review tool, if applicable. No additional management support is needed unless otherwise documented below in the visit note. 

## 2016-03-30 ENCOUNTER — Encounter: Payer: Self-pay | Admitting: Internal Medicine

## 2016-03-30 NOTE — Assessment & Plan Note (Signed)
Recent a1c improved - 7.1.  Low carb diet and exercise.  Follow met b and a1c.

## 2016-03-30 NOTE — Assessment & Plan Note (Signed)
Blood pressure under good control.  Continue same medication regimen.  Follow pressures.  Follow metabolic panel.   

## 2016-03-30 NOTE — Assessment & Plan Note (Signed)
Low cholesterol diet and exercise.  On crestor.  Follow lipid panel and liver function tests.  

## 2016-04-23 ENCOUNTER — Ambulatory Visit
Admission: RE | Admit: 2016-04-23 | Discharge: 2016-04-23 | Disposition: A | Discharge status: Home / Self Care | Payer: BC Managed Care – PPO | Source: Ambulatory Visit | Attending: Internal Medicine | Admitting: Internal Medicine

## 2016-04-23 DIAGNOSIS — Z1239 Encounter for other screening for malignant neoplasm of breast: Secondary | ICD-10-CM

## 2016-04-24 ENCOUNTER — Other Ambulatory Visit: Payer: Self-pay | Admitting: Internal Medicine

## 2016-04-24 ENCOUNTER — Ambulatory Visit
Admission: RE | Admit: 2016-04-24 | Discharge: 2016-04-24 | Disposition: A | Discharge status: Home / Self Care | Payer: BC Managed Care – PPO | Source: Ambulatory Visit | Attending: Internal Medicine | Admitting: Internal Medicine

## 2016-04-24 DIAGNOSIS — Z1231 Encounter for screening mammogram for malignant neoplasm of breast: Secondary | ICD-10-CM | POA: Insufficient documentation

## 2016-04-24 DIAGNOSIS — Z1239 Encounter for other screening for malignant neoplasm of breast: Secondary | ICD-10-CM

## 2016-04-25 ENCOUNTER — Encounter: Payer: Self-pay | Admitting: Internal Medicine

## 2016-04-25 DIAGNOSIS — Z0000 Encounter for general adult medical examination without abnormal findings: Secondary | ICD-10-CM | POA: Insufficient documentation

## 2016-06-13 ENCOUNTER — Other Ambulatory Visit: Payer: Self-pay

## 2016-06-13 MED ORDER — METFORMIN HCL ER 500 MG PO TB24: ORAL_TABLET | ORAL | 0 refills | Status: DC

## 2016-07-08 ENCOUNTER — Other Ambulatory Visit (INDEPENDENT_AMBULATORY_CARE_PROVIDER_SITE_OTHER): Payer: BC Managed Care – PPO

## 2016-07-08 DIAGNOSIS — E7800 Pure hypercholesterolemia, unspecified: Secondary | ICD-10-CM

## 2016-07-08 DIAGNOSIS — E119 Type 2 diabetes mellitus without complications: Secondary | ICD-10-CM | POA: Diagnosis not present

## 2016-07-08 LAB — LIPID PANEL
CHOLESTEROL: 139 mg/dL (ref 0–200)
HDL: 35.9 mg/dL — AB (ref >39.00)
LDL Cholesterol: 73 mg/dL (ref 0–99)
NONHDL: 102.75
Total CHOL/HDL Ratio: 4
Triglycerides: 149 mg/dL (ref 0.0–149.0)
VLDL: 29.8 mg/dL (ref 0.0–40.0)

## 2016-07-08 LAB — HEPATIC FUNCTION PANEL
ALK PHOS: 48 U/L (ref 39–117)
ALT: 17 U/L (ref 0–35)
AST: 13 U/L (ref 0–37)
Albumin: 4.3 g/dL (ref 3.5–5.2)
BILIRUBIN TOTAL: 0.8 mg/dL (ref 0.2–1.2)
Bilirubin, Direct: 0.1 mg/dL (ref 0.0–0.3)
Total Protein: 7.2 g/dL (ref 6.0–8.3)

## 2016-07-08 LAB — BASIC METABOLIC PANEL
BUN: 13 mg/dL (ref 6–23)
CHLORIDE: 101 meq/L (ref 96–112)
CO2: 30 meq/L (ref 19–32)
CREATININE: 0.59 mg/dL (ref 0.40–1.20)
Calcium: 9.8 mg/dL (ref 8.4–10.5)
GFR: 112.54 mL/min (ref >60.00)
GLUCOSE: 143 mg/dL — AB (ref 70–99)
Potassium: 4.1 mEq/L (ref 3.5–5.1)
Sodium: 139 mEq/L (ref 135–145)

## 2016-07-08 LAB — HEMOGLOBIN A1C: Hgb A1c MFr Bld: 6.8 % — ABNORMAL HIGH (ref 4.6–6.5)

## 2016-07-09 ENCOUNTER — Encounter: Payer: Self-pay | Admitting: Internal Medicine

## 2016-07-10 ENCOUNTER — Other Ambulatory Visit (HOSPITAL_COMMUNITY)
Admission: RE | Admit: 2016-07-10 | Discharge: 2016-07-10 | Disposition: A | Discharge status: Home / Self Care | Payer: BC Managed Care – PPO | Source: Ambulatory Visit | Attending: Internal Medicine | Admitting: Internal Medicine

## 2016-07-10 ENCOUNTER — Ambulatory Visit (INDEPENDENT_AMBULATORY_CARE_PROVIDER_SITE_OTHER): Payer: BC Managed Care – PPO | Admitting: Internal Medicine

## 2016-07-10 ENCOUNTER — Encounter: Payer: Self-pay | Admitting: Internal Medicine

## 2016-07-10 VITALS — BP 118/68 | HR 87 | Temp 98.4°F | Ht 62.0 in | Wt 219.0 lb

## 2016-07-10 DIAGNOSIS — E7800 Pure hypercholesterolemia, unspecified: Secondary | ICD-10-CM | POA: Diagnosis not present

## 2016-07-10 DIAGNOSIS — F329 Major depressive disorder, single episode, unspecified: Secondary | ICD-10-CM

## 2016-07-10 DIAGNOSIS — F419 Anxiety disorder, unspecified: Secondary | ICD-10-CM

## 2016-07-10 DIAGNOSIS — Z01419 Encounter for gynecological examination (general) (routine) without abnormal findings: Secondary | ICD-10-CM | POA: Insufficient documentation

## 2016-07-10 DIAGNOSIS — Z0000 Encounter for general adult medical examination without abnormal findings: Secondary | ICD-10-CM | POA: Diagnosis not present

## 2016-07-10 DIAGNOSIS — I10 Essential (primary) hypertension: Secondary | ICD-10-CM

## 2016-07-10 DIAGNOSIS — F418 Other specified anxiety disorders: Secondary | ICD-10-CM

## 2016-07-10 DIAGNOSIS — Z124 Encounter for screening for malignant neoplasm of cervix: Secondary | ICD-10-CM | POA: Diagnosis not present

## 2016-07-10 DIAGNOSIS — E119 Type 2 diabetes mellitus without complications: Secondary | ICD-10-CM

## 2016-07-10 DIAGNOSIS — Z1151 Encounter for screening for human papillomavirus (HPV): Secondary | ICD-10-CM | POA: Insufficient documentation

## 2016-07-10 NOTE — Progress Notes (Signed)
Patient ID: Caroline Meyers, female   DOB: Sep 03, 1960, 56 y.o.   MRN: [id number]   Subjective:    Patient ID: Caroline Meyers, female    DOB: May 06, 1961, 56 y.o.   MRN: [id number]  HPI  Patient here for her physical exam.  She has diabetes.  Has adjusted her diet.  Weight stable from previous check.  Labs reviewed.  a1c 6.8.  Sugars reviewed.  Discussed diet and exercise.  She is not exercising.  No chest pain.  Previous sore throat.  Better now.  No chest congestion.  No sob.  No acid reflux.  No abdominal pain.  Still having some loose stool.  Is some better on extended release metformin.  She was wandering if the loose stool could be related to increased stress and diet changes.  She is eating more sugar.  Previously noticed some vaginal irritation.  Better now.  No bleeding.  No periods for 2.5-3 years.     Past Medical History:  Diagnosis Date  . Achilles tendon tear    age 73  . Environmental allergies   . Hypercholesteremia   . Hyperglycemia   . Hypertension   . Sleep apnea    Past Surgical History:  Procedure Laterality Date  . BREAST BIOPSY Right 2008   cyst removed  . lumbar microdiskectomy  07/03/09   Family History  Problem Relation Age of Onset  . Alzheimer's disease      grandmother  . Heart disease      myocardial infarction (58s)- grandfather  . Hypercholesterolemia Mother   . Hypercholesterolemia Sister   . Breast cancer Neg Hx   . Colon cancer Neg Hx    Social History   Social History  . Marital status: Married    Spouse name: N/A  . Number of children: 1  . Years of education: N/A   Occupational History  . teacher [employer]   Social History Main Topics  . Smoking status: Never Smoker  . Smokeless tobacco: Never Used  . Alcohol use No  . Drug use:     Types: Hydrocodone  . Sexual activity: Not Asked   Other Topics Concern  . None   Social History Narrative  . None    Outpatient Encounter Prescriptions as of 07/10/2016    Medication Sig  . acetaminophen (TYLENOL) 325 MG tablet Take 650 mg by mouth as needed for headache.  Marland Kitchen EPINEPHrine (EPIPEN 2-PAK) 0.3 mg/0.3 mL IJ SOAJ injection Inject 0.3 mLs (0.3 mg total) into the muscle once.  . metFORMIN (GLUCOPHAGE-XR) 500 MG 24 hr tablet Take 2 tablets bid  . ONETOUCH VERIO test strip CHECK BLOOD SUGAR TWICE DAILY  . rosuvastatin (CRESTOR) 5 MG tablet TAKE 1 TABLET (5 MG TOTAL) BY MOUTH DAILY.   No facility-administered encounter medications on file as of 07/10/2016.     Review of Systems  Constitutional: Negative for appetite change and unexpected weight change.  HENT: Negative for congestion and sinus pressure.   Eyes: Negative for pain and visual disturbance.  Respiratory: Negative for cough, chest tightness and shortness of breath.   Cardiovascular: Negative for chest pain, palpitations and leg swelling.  Gastrointestinal: Negative for abdominal pain, nausea and vomiting.       Some loose stool intermittently.    Genitourinary: Negative for difficulty urinating and dysuria.  Musculoskeletal: Negative for back pain and joint swelling.  Skin: Negative for color change and rash.  Neurological: Negative for dizziness, light-headedness and headaches.  Hematological: Negative for  adenopathy. Does not bruise/bleed easily.  Psychiatric/Behavioral: Negative for agitation and dysphoric mood.       Objective:    Physical Exam  Constitutional: She is oriented to person, place, and time. She appears well-developed and well-nourished.  HENT:  Nose: Nose normal.  Mouth/Throat: Oropharynx is clear and moist.  Eyes: Right eye exhibits no discharge. Left eye exhibits no discharge. No scleral icterus.  Neck: Neck supple. No thyromegaly present.  Cardiovascular: Normal rate and regular rhythm.   Pulmonary/Chest: Breath sounds normal. No accessory muscle usage. No tachypnea. No respiratory distress. She has no decreased breath sounds. She has no wheezes. She has no  rhonchi. Right breast exhibits no inverted nipple, no mass, no nipple discharge and no tenderness (no axillary adenopathy). Left breast exhibits no inverted nipple, no mass, no nipple discharge and no tenderness (no axilarry adenopathy).  Abdominal: Soft. Bowel sounds are normal. There is no tenderness.  Genitourinary:  Genitourinary Comments: Normal external genitalia.  Vaginal vault without lesions.  Cervix identified.  Pap smear performed.  Could not appreciate any adnexal masses or tenderness.    Musculoskeletal: She exhibits no edema or tenderness.  Lymphadenopathy:    She has no cervical adenopathy.  Neurological: She is alert and oriented to person, place, and time.  Skin: Skin is warm. No rash noted. No erythema.  Psychiatric: She has a normal mood and affect. Her behavior is normal.    BP 118/68   Pulse 87   Temp 98.4 F (36.9 C) (Oral)   Ht '5\' 2"'$  (1.575 m)   Wt 219 lb (99.3 kg)   LMP 03/09/2012   SpO2 94%   BMI 40.06 kg/m  Wt Readings from Last 3 Encounters:  07/10/16 219 lb (99.3 kg)  03/27/16 218 lb (98.9 kg)  01/03/16 221 lb 4 oz (100.4 kg)     Lab Results  Component Value Date   WBC 7.7 10/18/2015   HGB 15.0 10/18/2015   HCT 44.5 10/18/2015   PLT 385.0 10/18/2015   GLUCOSE 143 (H) 07/08/2016   CHOL 139 07/08/2016   TRIG 149.0 07/08/2016   HDL 35.90 (L) 07/08/2016   LDLDIRECT 176.7 10/07/2012   LDLCALC 73 07/08/2016   ALT 17 07/08/2016   AST 13 07/08/2016   NA 139 07/08/2016   K 4.1 07/08/2016   CL 101 07/08/2016   CREATININE 0.59 07/08/2016   BUN 13 07/08/2016   CO2 30 07/08/2016   TSH 3.87 10/18/2015   HGBA1C 6.8 (H) 07/08/2016   MICROALBUR 6.0 (H) 10/18/2015    Mm Screening Breast Tomo Bilateral  Result Date: 04/24/2016 CLINICAL DATA:  Screening. EXAM: 2D DIGITAL SCREENING BILATERAL MAMMOGRAM WITH CAD AND ADJUNCT TOMO COMPARISON:  Previous exam(s). ACR Breast Density Category a: The breast tissue is almost entirely fatty. FINDINGS: There are no  findings suspicious for malignancy. Images were processed with CAD. IMPRESSION: No mammographic evidence of malignancy. A result letter of this screening mammogram will be mailed directly to the patient. RECOMMENDATION: Screening mammogram in one year. (Code:SM-B-01Y) BI-RADS CATEGORY  1: Negative. Electronically Signed   By: Everlean Alstrom M.D.   On: 04/24/2016 10:56       Assessment & Plan:   Problem List Items Addressed This Visit    Anxiety and depression    Doing better.        Diabetes mellitus ([place])    Low carb diet and exercise.  Follow met b and a1c.  Sugars improved.   Lab Results  Component Value Date   HGBA1C  6.8 (H) 07/08/2016        Relevant Orders   Hemoglobin A1c   Microalbumin / creatinine urine ratio   Health care maintenance    Physical today 07/10/16.  PAP 07/10/16.  Mammogram 04/24/16 -birads I.  Discussed colonoscopy.        Hypercholesterolemia    Low cholesterol diet and exercise.  On crestor.  Follow lipid panel and liver function tests.        Relevant Orders   Hepatic function panel   Lipid panel   Hypertension    Blood pressure under good control.  Continue same medication regimen.  Follow pressures.  Follow metabolic panel.        Relevant Orders   CBC with Differential/Platelet   Basic metabolic panel   TSH    Other Visit Diagnoses    Routine general medical examination at a health care facility    -  Primary   Pap smear for cervical cancer screening       Relevant Orders   Cytology - PAP       [name], MD

## 2016-07-10 NOTE — Patient Instructions (Signed)
Probiotics - align, culturelle or florastor 

## 2016-07-10 NOTE — Progress Notes (Signed)
Pre visit review using our clinic review tool, if applicable. No additional management support is needed unless otherwise documented below in the visit note. 

## 2016-07-10 NOTE — Assessment & Plan Note (Addendum)
Physical today 07/10/16.  PAP 07/10/16.  Mammogram 04/24/16 -birads I.  Discussed colonoscopy.

## 2016-07-13 ENCOUNTER — Encounter: Payer: Self-pay | Admitting: Internal Medicine

## 2016-07-13 NOTE — Assessment & Plan Note (Signed)
Doing better.   

## 2016-07-13 NOTE — Assessment & Plan Note (Signed)
Low carb diet and exercise.  Follow met b and a1c.  Sugars improved.   Lab Results  Component Value Date   HGBA1C 6.8 (H) 07/08/2016   

## 2016-07-13 NOTE — Assessment & Plan Note (Signed)
Blood pressure under good control.  Continue same medication regimen.  Follow pressures.  Follow metabolic panel.   

## 2016-07-13 NOTE — Assessment & Plan Note (Signed)
Low cholesterol diet and exercise.  On crestor.  Follow lipid panel and liver function tests.  

## 2016-07-14 LAB — CYTOLOGY - PAP
Diagnosis: NEGATIVE
HPV: NOT DETECTED

## 2016-07-15 ENCOUNTER — Encounter: Payer: Self-pay | Admitting: Internal Medicine

## 2016-09-12 ENCOUNTER — Other Ambulatory Visit: Payer: Self-pay | Admitting: Internal Medicine

## 2016-09-18 ENCOUNTER — Other Ambulatory Visit: Payer: Self-pay | Admitting: Internal Medicine

## 2016-10-12 ENCOUNTER — Other Ambulatory Visit: Payer: Self-pay | Admitting: Internal Medicine

## 2016-10-15 ENCOUNTER — Encounter: Payer: Self-pay | Admitting: Internal Medicine

## 2016-10-15 NOTE — Telephone Encounter (Signed)
Called patient states that she is feeling a little better on the azo I informed that if she has UTI it will not clear it up only helps with symptoms. Informed her that Dr. Nicki Reaper is out of the office today and does not have any open appointments coming up. Offered her to schedule with another provider in the office. She declined states she only wants to see Dr. Nicki Reaper. Suggested that she be seen at acute care/walk in. She declined. Patient states that if she starts having any symptoms she will call back for appointment.

## 2016-11-07 ENCOUNTER — Telehealth: Payer: Self-pay | Admitting: Internal Medicine

## 2016-11-07 NOTE — Telephone Encounter (Signed)
Appointment canceled for Caroline, Meyers ([id number])  Visit Type: OFFICE VISIT 30  Date    Time   Length  Provider         Department  11/13/2016  8:00 AM 30 mins. [name], MD    [facility]      Reason for Cancellation: Canceled via MyChart    Patient Comments: I have a meeting that was unplanned that I will need to attend. I do not plan to come for labs or the visit. I will need to reschedule. Thank You

## 2016-11-11 ENCOUNTER — Other Ambulatory Visit: Payer: BC Managed Care – PPO

## 2016-11-12 NOTE — Telephone Encounter (Signed)
Lm on vm for pt to call back and schedule

## 2016-11-13 ENCOUNTER — Ambulatory Visit: Payer: BC Managed Care – PPO | Admitting: Internal Medicine

## 2016-12-12 ENCOUNTER — Other Ambulatory Visit: Payer: Self-pay | Admitting: Internal Medicine

## 2017-01-09 ENCOUNTER — Other Ambulatory Visit: Payer: Self-pay | Admitting: Internal Medicine

## 2017-03-20 ENCOUNTER — Other Ambulatory Visit: Payer: Self-pay | Admitting: Internal Medicine

## 2017-03-27 ENCOUNTER — Telehealth: Payer: Self-pay | Admitting: Internal Medicine

## 2017-03-27 ENCOUNTER — Other Ambulatory Visit: Payer: Self-pay | Admitting: *Deleted

## 2017-03-27 MED ORDER — ROSUVASTATIN CALCIUM 5 MG PO TABS: ORAL_TABLET | ORAL | 2 refills | Status: DC

## 2017-03-27 NOTE — Telephone Encounter (Signed)
Scheduled patient lab and follow up with PCP for Caroline Meyers, filled Crestor until appointment and patient also advised nurse she is only taking Metformin 500 mg BID instead of 1000 mg BID due to the 1000 mg dose causes abdominal pain and diarrhea. Patient is scheduled for November.

## 2017-03-27 NOTE — Telephone Encounter (Signed)
Thank you.  Let me know if anything I need to do.

## 2017-05-19 ENCOUNTER — Other Ambulatory Visit (INDEPENDENT_AMBULATORY_CARE_PROVIDER_SITE_OTHER): Payer: BC Managed Care – PPO

## 2017-05-19 DIAGNOSIS — E7800 Pure hypercholesterolemia, unspecified: Secondary | ICD-10-CM

## 2017-05-19 DIAGNOSIS — I10 Essential (primary) hypertension: Secondary | ICD-10-CM

## 2017-05-19 DIAGNOSIS — E119 Type 2 diabetes mellitus without complications: Secondary | ICD-10-CM | POA: Diagnosis not present

## 2017-05-19 LAB — BASIC METABOLIC PANEL
BUN: 14 mg/dL (ref 6–23)
CALCIUM: 9.8 mg/dL (ref 8.4–10.5)
CO2: 28 mEq/L (ref 19–32)
Chloride: 100 mEq/L (ref 96–112)
Creatinine, Ser: 0.56 mg/dL (ref 0.40–1.20)
GFR: 119.15 mL/min (ref >60.00)
Glucose, Bld: 149 mg/dL — ABNORMAL HIGH (ref 70–99)
Potassium: 4 mEq/L (ref 3.5–5.1)
SODIUM: 136 meq/L (ref 135–145)

## 2017-05-19 LAB — HEPATIC FUNCTION PANEL
ALBUMIN: 4.4 g/dL (ref 3.5–5.2)
ALK PHOS: 52 U/L (ref 39–117)
ALT: 14 U/L (ref 0–35)
AST: 14 U/L (ref 0–37)
Bilirubin, Direct: 0.2 mg/dL (ref 0.0–0.3)
Total Bilirubin: 0.8 mg/dL (ref 0.2–1.2)
Total Protein: 7.5 g/dL (ref 6.0–8.3)

## 2017-05-19 LAB — CBC WITH DIFFERENTIAL/PLATELET
BASOS ABS: 0.1 10*3/uL (ref 0.0–0.1)
Basophils Relative: 1.3 % (ref 0.0–3.0)
EOS ABS: 0.2 10*3/uL (ref 0.0–0.7)
Eosinophils Relative: 2 % (ref 0.0–5.0)
HCT: 45.4 % (ref 36.0–46.0)
Hemoglobin: 15.2 g/dL — ABNORMAL HIGH (ref 12.0–15.0)
LYMPHS ABS: 3 10*3/uL (ref 0.7–4.0)
Lymphocytes Relative: 38.7 % (ref 12.0–46.0)
MCHC: 33.4 g/dL (ref 30.0–36.0)
MCV: 92.5 fl (ref 78.0–100.0)
Monocytes Absolute: 0.3 10*3/uL (ref 0.1–1.0)
Monocytes Relative: 4 % (ref 3.0–12.0)
NEUTROS ABS: 4.2 10*3/uL (ref 1.4–7.7)
NEUTROS PCT: 54 % (ref 43.0–77.0)
PLATELETS: 337 10*3/uL (ref 150.0–400.0)
RBC: 4.91 Mil/uL (ref 3.87–5.11)
RDW: 13.3 % (ref 11.5–15.5)
WBC: 7.8 10*3/uL (ref 4.0–10.5)

## 2017-05-19 LAB — LIPID PANEL
CHOLESTEROL: 172 mg/dL (ref 0–200)
HDL: 36.5 mg/dL — AB (ref >39.00)
LDL Cholesterol: 104 mg/dL — ABNORMAL HIGH (ref 0–99)
NonHDL: 135.84
Total CHOL/HDL Ratio: 5
Triglycerides: 158 mg/dL — ABNORMAL HIGH (ref 0.0–149.0)
VLDL: 31.6 mg/dL (ref 0.0–40.0)

## 2017-05-19 LAB — TSH: TSH: 7.45 u[IU]/mL — AB (ref 0.35–4.50)

## 2017-05-19 LAB — HEMOGLOBIN A1C: Hgb A1c MFr Bld: 7.2 % — ABNORMAL HIGH (ref 4.6–6.5)

## 2017-05-19 LAB — MICROALBUMIN / CREATININE URINE RATIO
Creatinine,U: 116 mg/dL
MICROALB UR: 2.4 mg/dL — AB (ref 0.0–1.9)
MICROALB/CREAT RATIO: 2 mg/g (ref 0.0–30.0)

## 2017-05-20 ENCOUNTER — Other Ambulatory Visit: Payer: Self-pay

## 2017-06-04 ENCOUNTER — Ambulatory Visit: Payer: BC Managed Care – PPO | Admitting: Internal Medicine

## 2017-06-04 ENCOUNTER — Encounter: Payer: Self-pay | Admitting: Internal Medicine

## 2017-06-04 VITALS — BP 132/78 | HR 90 | Temp 98.3°F | Resp 18 | Ht 62.0 in | Wt 222.0 lb

## 2017-06-04 DIAGNOSIS — F32A Depression, unspecified: Secondary | ICD-10-CM

## 2017-06-04 DIAGNOSIS — E7800 Pure hypercholesterolemia, unspecified: Secondary | ICD-10-CM | POA: Diagnosis not present

## 2017-06-04 DIAGNOSIS — Z1231 Encounter for screening mammogram for malignant neoplasm of breast: Secondary | ICD-10-CM

## 2017-06-04 DIAGNOSIS — E119 Type 2 diabetes mellitus without complications: Secondary | ICD-10-CM

## 2017-06-04 DIAGNOSIS — Z1239 Encounter for other screening for malignant neoplasm of breast: Secondary | ICD-10-CM

## 2017-06-04 DIAGNOSIS — F419 Anxiety disorder, unspecified: Secondary | ICD-10-CM | POA: Diagnosis not present

## 2017-06-04 DIAGNOSIS — F329 Major depressive disorder, single episode, unspecified: Secondary | ICD-10-CM

## 2017-06-04 DIAGNOSIS — I10 Essential (primary) hypertension: Secondary | ICD-10-CM

## 2017-06-04 MED ORDER — METFORMIN HCL ER 500 MG PO TB24: ORAL_TABLET | ORAL | 2 refills | Status: DC

## 2017-06-04 NOTE — Progress Notes (Signed)
Patient ID: Caroline Meyers, female   DOB: 08-19-1960, 56 y.o.   MRN: [id number]   Subjective:    Patient ID: Caroline Meyers, female    DOB: [birth year], 56 y.o.   MRN: [id number]  HPI  Patient here for a scheduled follow up.  Increased stress recently with some family issues.  She reports she has been eating more chocolate.  Not watching diet as well.  Stress eating.  Taking metformin.  Discussed changing to extended release form.  Was having some bowel issues, but bowels better now.  a1c just checked 05/19/17 - 7.2.  States stress is some better now.  Plans to get more serious about her diet and exercise.  Discussed changing medications.  Wants to hold on changing medications.     Past Medical History:  Diagnosis Date  . Achilles tendon tear    age 59  . Environmental allergies   . Hypercholesteremia   . Hyperglycemia   . Hypertension   . Sleep apnea    Past Surgical History:  Procedure Laterality Date  . BREAST BIOPSY Right 2008   cyst removed  . lumbar microdiskectomy  07/03/09   Family History  Problem Relation Age of Onset  . Alzheimer's disease Unknown        grandmother  . Heart disease Unknown        myocardial infarction (48s)- grandfather  . Hypercholesterolemia Mother   . Hypercholesterolemia Sister   . Breast cancer Neg Hx   . Colon cancer Neg Hx    Social History   Socioeconomic History  . Marital status: Married    Spouse name: None  . Number of children: 1  . Years of education: None  . Highest education level: None  Social Needs  . Financial resource strain: None  . Food insecurity - worry: None  . Food insecurity - inability: None  . Transportation needs - medical: None  . Transportation needs - non-medical: None  Occupational History  . Occupation: Product manager: [employer]  Tobacco Use  . Smoking status: Never Smoker  . Smokeless tobacco: Never Used  Substance and Sexual Activity  . Alcohol use: No    Alcohol/week: 0.0 oz   . Drug use: Yes    Types: Hydrocodone  . Sexual activity: None  Other Topics Concern  . None  Social History Narrative  . None    Outpatient Encounter Medications as of 06/04/2017  Medication Sig  . acetaminophen (TYLENOL) 325 MG tablet Take 650 mg by mouth as needed for headache.  Marland Kitchen EPINEPHrine (EPIPEN 2-PAK) 0.3 mg/0.3 mL IJ SOAJ injection Inject 0.3 mLs (0.3 mg total) into the muscle once.  Glory Rosebush VERIO test strip CHECK BLOOD SUGAR TWICE DAILY  . rosuvastatin (CRESTOR) 5 MG tablet TAKE 1 TABLET (5 MG TOTAL) BY MOUTH DAILY.  . [DISCONTINUED] metFORMIN (GLUCOPHAGE-XR) 500 MG 24 hr tablet TAKE 2 TABLETS BY MOUTH TWICE A DAY (Patient taking differently: 1 tab bid, patient takes 1 tab in the morning and 1 tab at night)  . metFORMIN (GLUCOPHAGE XR) 500 MG 24 hr tablet Take one tablet bid   No facility-administered encounter medications on file as of 06/04/2017.     Review of Systems  Constitutional: Negative for appetite change.       Not watching her diet.    HENT: Negative for congestion and sinus pressure.   Respiratory: Negative for cough, chest tightness and shortness of breath.   Cardiovascular: Negative for chest pain,  palpitations and leg swelling.  Gastrointestinal: Negative for abdominal pain, diarrhea, nausea and vomiting.  Genitourinary: Negative for difficulty urinating and dysuria.  Musculoskeletal: Negative for back pain and joint swelling.  Skin: Negative for color change and rash.  Neurological: Negative for dizziness, light-headedness and headaches.  Psychiatric/Behavioral: Negative for agitation and dysphoric mood.       Objective:    Physical Exam  Constitutional: She appears well-developed and well-nourished. No distress.  HENT:  Nose: Nose normal.  Mouth/Throat: Oropharynx is clear and moist.  Neck: Neck supple. No thyromegaly present.  Cardiovascular: Normal rate and regular rhythm.  Pulmonary/Chest: Breath sounds normal. No respiratory distress.  She has no wheezes.  Abdominal: Soft. Bowel sounds are normal. There is no tenderness.  Musculoskeletal: She exhibits no edema or tenderness.  Lymphadenopathy:    She has no cervical adenopathy.  Skin: No rash noted. No erythema.  Psychiatric: She has a normal mood and affect. Her behavior is normal.    BP 132/78   Pulse 90   Temp 98.3 F (36.8 C)   Resp 18   Ht 5' 2" (1.575 m)   Wt 222 lb (100.7 kg)   LMP 03/09/2012   SpO2 95%   BMI 40.60 kg/m  Wt Readings from Last 3 Encounters:  06/04/17 222 lb (100.7 kg)  07/10/16 219 lb (99.3 kg)  03/27/16 218 lb (98.9 kg)     Lab Results  Component Value Date   WBC 7.8 05/19/2017   HGB 15.2 (H) 05/19/2017   HCT 45.4 05/19/2017   PLT 337.0 05/19/2017   GLUCOSE 149 (H) 05/19/2017   CHOL 172 05/19/2017   TRIG 158.0 (H) 05/19/2017   HDL 36.50 (L) 05/19/2017   LDLDIRECT 176.7 10/07/2012   LDLCALC 104 (H) 05/19/2017   ALT 14 05/19/2017   AST 14 05/19/2017   NA 136 05/19/2017   K 4.0 05/19/2017   CL 100 05/19/2017   CREATININE 0.56 05/19/2017   BUN 14 05/19/2017   CO2 28 05/19/2017   TSH 7.45 (H) 05/19/2017   HGBA1C 7.2 (H) 05/19/2017   MICROALBUR 2.4 (H) 05/19/2017       Assessment & Plan:   Problem List Items Addressed This Visit    Anxiety and depression    Increased stress as outlined.  Discussed with her today.  She does not feel needs anything more at this time.  Follow.        Diabetes mellitus ([place])    Discussed diet and exercise.  Will change metformin to XR.  Low carb diet and exercise.  Follow met b and a1c.        Relevant Medications   metFORMIN (GLUCOPHAGE XR) 500 MG 24 hr tablet   Hypercholesterolemia    Low cholesterol diet and exercise.  On crestor.  Follow lipid panel and liver function tests.        Hypertension    Blood pressure under good control.  Continue same medication regimen.  Follow pressures.  Follow metabolic panel.         Other Visit Diagnoses    Breast cancer screening    -   Primary   Relevant Orders   MM SCREENING BREAST TOMO BILATERAL       [name], MD

## 2017-06-07 ENCOUNTER — Encounter: Payer: Self-pay | Admitting: Internal Medicine

## 2017-06-07 NOTE — Assessment & Plan Note (Signed)
Increased stress as outlined.  Discussed with her today.  She does not feel needs anything more at this time.  Follow.   

## 2017-06-07 NOTE — Assessment & Plan Note (Signed)
Blood pressure under good control.  Continue same medication regimen.  Follow pressures.  Follow metabolic panel.   

## 2017-06-07 NOTE — Assessment & Plan Note (Signed)
Discussed diet and exercise.  Will change metformin to XR.  Low carb diet and exercise.  Follow met b and a1c.

## 2017-06-07 NOTE — Assessment & Plan Note (Signed)
Low cholesterol diet and exercise.  On crestor.  Follow lipid panel and liver function tests.  

## 2017-06-12 ENCOUNTER — Other Ambulatory Visit: Payer: Self-pay | Admitting: Internal Medicine

## 2017-07-10 ENCOUNTER — Ambulatory Visit
Admission: RE | Admit: 2017-07-10 | Discharge: 2017-07-10 | Disposition: A | Discharge status: Home / Self Care | Payer: BC Managed Care – PPO | Source: Ambulatory Visit | Attending: Internal Medicine | Admitting: Internal Medicine

## 2017-07-10 DIAGNOSIS — Z1231 Encounter for screening mammogram for malignant neoplasm of breast: Secondary | ICD-10-CM | POA: Insufficient documentation

## 2017-07-10 DIAGNOSIS — Z1239 Encounter for other screening for malignant neoplasm of breast: Secondary | ICD-10-CM

## 2017-08-06 ENCOUNTER — Other Ambulatory Visit: Payer: Self-pay

## 2017-08-06 MED ORDER — ROSUVASTATIN CALCIUM 5 MG PO TABS: ORAL_TABLET | ORAL | 2 refills | Status: DC

## 2017-08-12 ENCOUNTER — Encounter: Payer: Self-pay | Admitting: Internal Medicine

## 2017-08-12 ENCOUNTER — Ambulatory Visit (INDEPENDENT_AMBULATORY_CARE_PROVIDER_SITE_OTHER): Payer: BC Managed Care – PPO | Admitting: Internal Medicine

## 2017-08-12 VITALS — BP 136/78 | HR 88 | Temp 99.1°F | Resp 18 | Wt 221.4 lb

## 2017-08-12 DIAGNOSIS — F419 Anxiety disorder, unspecified: Secondary | ICD-10-CM | POA: Diagnosis not present

## 2017-08-12 DIAGNOSIS — F32A Depression, unspecified: Secondary | ICD-10-CM

## 2017-08-12 DIAGNOSIS — E7800 Pure hypercholesterolemia, unspecified: Secondary | ICD-10-CM

## 2017-08-12 DIAGNOSIS — Z1211 Encounter for screening for malignant neoplasm of colon: Secondary | ICD-10-CM

## 2017-08-12 DIAGNOSIS — E119 Type 2 diabetes mellitus without complications: Secondary | ICD-10-CM | POA: Diagnosis not present

## 2017-08-12 DIAGNOSIS — F329 Major depressive disorder, single episode, unspecified: Secondary | ICD-10-CM | POA: Diagnosis not present

## 2017-08-12 DIAGNOSIS — Z0000 Encounter for general adult medical examination without abnormal findings: Secondary | ICD-10-CM | POA: Diagnosis not present

## 2017-08-12 LAB — HM DIABETES FOOT EXAM

## 2017-08-12 NOTE — Assessment & Plan Note (Addendum)
Physical today 08/12/17.  PAP 07/10/16 - negative with negative HPV.  Mammogram 07/10/17 - Birads I.  Overdue colonoscopy.  She is in agreement for referral.

## 2017-08-12 NOTE — Progress Notes (Signed)
Patient ID: Caroline Meyers, female   DOB: 09/09/1960, 57 y.o.   MRN: [id number]   Subjective:    Patient ID: Caroline Meyers, female    DOB: May 16, 1961, 57 y.o.   MRN: [id number]  HPI  Patient here for her physical exam.  She reports she is doing relatively well.  Overall feels she is handling stress relatively well.  Does not feel needs any further intervention.  Has not been watching her diet.  Discussed diet and exercise.  No chest pain. No sob.  No acid reflux.  No abdominal pain.  Bowels moving.  Brought in no recorded sugar readings.     Past Medical History:  Diagnosis Date  . Achilles tendon tear    age 22  . Environmental allergies   . Hypercholesteremia   . Hyperglycemia   . Hypertension   . Sleep apnea    Past Surgical History:  Procedure Laterality Date  . BREAST BIOPSY Right 2008   cyst removed  . lumbar microdiskectomy  07/03/09   Family History  Problem Relation Age of Onset  . Alzheimer's disease Unknown        grandmother  . Heart disease Unknown        myocardial infarction (66s)- grandfather  . Hypercholesterolemia Mother   . Hypercholesterolemia Sister   . Breast cancer Neg Hx   . Colon cancer Neg Hx    Social History   Socioeconomic History  . Marital status: Married    Spouse name: None  . Number of children: 1  . Years of education: None  . Highest education level: None  Social Needs  . Financial resource strain: None  . Food insecurity - worry: None  . Food insecurity - inability: None  . Transportation needs - medical: None  . Transportation needs - non-medical: None  Occupational History  . Occupation: Product manager: [employer]  Tobacco Use  . Smoking status: Never Smoker  . Smokeless tobacco: Never Used  Substance and Sexual Activity  . Alcohol use: No    Alcohol/week: 0.0 oz  . Drug use: Yes    Types: Hydrocodone  . Sexual activity: None  Other Topics Concern  . None  Social History Narrative  . None     Outpatient Encounter Medications as of 08/12/2017  Medication Sig  . acetaminophen (TYLENOL) 325 MG tablet Take 650 mg by mouth as needed for headache.  Marland Kitchen EPINEPHrine (EPIPEN 2-PAK) 0.3 mg/0.3 mL IJ SOAJ injection Inject 0.3 mLs (0.3 mg total) into the muscle once.  . metFORMIN (GLUCOPHAGE XR) 500 MG 24 hr tablet Take one tablet bid  . ONETOUCH VERIO test strip CHECK BLOOD SUGAR TWICE DAILY  . rosuvastatin (CRESTOR) 5 MG tablet TAKE 1 TABLET BY MOUTH EVERY DAY   No facility-administered encounter medications on file as of 08/12/2017.     Review of Systems  Constitutional: Negative for appetite change and unexpected weight change.  HENT: Negative for congestion and sinus pressure.   Eyes: Negative for pain and visual disturbance.  Respiratory: Negative for cough, chest tightness and shortness of breath.   Cardiovascular: Negative for chest pain, palpitations and leg swelling.  Gastrointestinal: Negative for abdominal pain, diarrhea, nausea and vomiting.  Genitourinary: Negative for difficulty urinating and dysuria.  Musculoskeletal: Negative for joint swelling and myalgias.  Skin: Negative for color change and rash.  Neurological: Negative for dizziness, light-headedness and headaches.  Hematological: Negative for adenopathy. Does not bruise/bleed easily.  Psychiatric/Behavioral: Negative for agitation  and dysphoric mood.       Objective:    Physical Exam  Constitutional: She is oriented to person, place, and time. She appears well-developed and well-nourished. No distress.  HENT:  Nose: Nose normal.  Mouth/Throat: Oropharynx is clear and moist.  Eyes: Right eye exhibits no discharge. Left eye exhibits no discharge. No scleral icterus.  Neck: Neck supple. No thyromegaly present.  Cardiovascular: Normal rate and regular rhythm.  Pulmonary/Chest: Breath sounds normal. No accessory muscle usage. No tachypnea. No respiratory distress. She has no decreased breath sounds. She has no  wheezes. She has no rhonchi. Right breast exhibits no inverted nipple, no mass, no nipple discharge and no tenderness (no axillary adenopathy). Left breast exhibits no inverted nipple, no mass, no nipple discharge and no tenderness (no axilarry adenopathy).  Abdominal: Soft. Bowel sounds are normal. There is no tenderness.  Musculoskeletal: She exhibits no edema or tenderness.  Feet:  No lesions.  DP pulses palpable and equal bilaterally.  Sensation intact to light touch and pin prick.  Lymphadenopathy:    She has no cervical adenopathy.  Neurological: She is alert and oriented to person, place, and time.  Skin: Skin is warm. No rash noted. No erythema.  Psychiatric: She has a normal mood and affect. Her behavior is normal.    BP 136/78 (BP Location: Left Arm, Patient Position: Sitting, Cuff Size: Large)   Pulse 88   Temp 99.1 F (37.3 C) (Oral)   Resp 18   Wt 221 lb 6.4 oz (100.4 kg)   LMP 03/09/2012   SpO2 96%   BMI 40.49 kg/m  Wt Readings from Last 3 Encounters:  08/12/17 221 lb 6.4 oz (100.4 kg)  06/04/17 222 lb (100.7 kg)  07/10/16 219 lb (99.3 kg)     Lab Results  Component Value Date   WBC 7.8 05/19/2017   HGB 15.2 (H) 05/19/2017   HCT 45.4 05/19/2017   PLT 337.0 05/19/2017   GLUCOSE 149 (H) 05/19/2017   CHOL 172 05/19/2017   TRIG 158.0 (H) 05/19/2017   HDL 36.50 (L) 05/19/2017   LDLDIRECT 176.7 10/07/2012   LDLCALC 104 (H) 05/19/2017   ALT 14 05/19/2017   AST 14 05/19/2017   NA 136 05/19/2017   K 4.0 05/19/2017   CL 100 05/19/2017   CREATININE 0.56 05/19/2017   BUN 14 05/19/2017   CO2 28 05/19/2017   TSH 7.45 (H) 05/19/2017   HGBA1C 7.2 (H) 05/19/2017   MICROALBUR 2.4 (H) 05/19/2017    Mm Screening Breast Tomo Bilateral  Result Date: 07/10/2017 CLINICAL DATA:  Screening. EXAM: 2D DIGITAL SCREENING BILATERAL MAMMOGRAM WITH 3D TOMO WITH CAD COMPARISON:  Previous exam(s). ACR Breast Density Category a: The breast tissue is almost entirely fatty. FINDINGS:  There are no findings suspicious for malignancy. Images were processed with CAD. IMPRESSION: No mammographic evidence of malignancy. A result letter of this screening mammogram will be mailed directly to the patient. RECOMMENDATION: Screening mammogram in one year. (Code:SM-B-01Y) BI-RADS CATEGORY  1: Negative. Electronically Signed   By: Nolon Nations M.D.   On: 07/10/2017 10:58       Assessment & Plan:   Problem List Items Addressed This Visit    Anxiety and depression    Overall doing better.  Discussed with her today.  Follow.        Diabetes mellitus ([place])    Low carb diet and exercise.  Weight loss.  Continue same medication regimen.  Discussed other treatment options.  Have her check and  record sugars.  Check metabolic panel and O0H.       Relevant Orders   Hemoglobin O1Y   Basic metabolic panel   Health care maintenance    Physical today 08/12/17.  PAP 07/10/16 - negative with negative HPV.  Mammogram 07/10/17 - Birads I.  Overdue colonoscopy.  She is in agreement for referral.        Hypercholesterolemia    On crestor.  Low cholesterol diet and exercise.  Follow lipid panel and liver function tests.        Relevant Orders   Hepatic function panel   Lipid panel    Other Visit Diagnoses    Routine general medical examination at a health care facility    -  Primary   Colon cancer screening       Relevant Orders   Ambulatory referral to Gastroenterology       [name], MD

## 2017-08-15 ENCOUNTER — Encounter: Payer: Self-pay | Admitting: Internal Medicine

## 2017-08-15 NOTE — Assessment & Plan Note (Signed)
Overall doing better.  Discussed with her today.  Follow.

## 2017-08-15 NOTE — Assessment & Plan Note (Signed)
On crestor.  Low cholesterol diet and exercise.  Follow lipid panel and liver function tests.   

## 2017-08-15 NOTE — Assessment & Plan Note (Signed)
Low carb diet and exercise.  Weight loss.  Continue same medication regimen.  Discussed other treatment options.  Have her check and record sugars.  Check metabolic panel and V4F.

## 2017-08-19 ENCOUNTER — Encounter: Payer: Self-pay | Admitting: Internal Medicine

## 2017-09-02 ENCOUNTER — Other Ambulatory Visit: Payer: BC Managed Care – PPO

## 2017-09-09 ENCOUNTER — Telehealth: Payer: Self-pay

## 2017-09-09 NOTE — Telephone Encounter (Signed)
Gastroenterology Pre-Procedure Review  Request Date: 03/08/18 Requesting Physician: Dr. Marius Ditch  PATIENT REVIEW QUESTIONS: The patient responded to the following health history questions as indicated:    1. Are you having any GI issues? No  2. Do you have a personal history of Polyps? No  3. Do you have a family history of Colon Cancer or Polyps? No  4. Diabetes Mellitus? Yes  5. Joint replacements in the past 12 months? No  6. Major health problems in the past 3 months? No  7. Any artificial heart valves, MVP, or defibrillator? No     MEDICATIONS & ALLERGIES:    Patient reports the following regarding taking any anticoagulation/antiplatelet therapy:   Plavix, Coumadin, Eliquis, Xarelto, Lovenox, Pradaxa, Brilinta, or Effient? No  Aspirin? No   Patient confirms/reports the following medications:  Current Outpatient Medications  Medication Sig Dispense Refill  . acetaminophen (TYLENOL) 325 MG tablet Take 650 mg by mouth as needed for headache.    Marland Kitchen EPINEPHrine (EPIPEN 2-PAK) 0.3 mg/0.3 mL IJ SOAJ injection Inject 0.3 mLs (0.3 mg total) into the muscle once. 1 Device 1  . metFORMIN (GLUCOPHAGE XR) 500 MG 24 hr tablet Take one tablet bid 60 tablet 2  . ONETOUCH VERIO test strip CHECK BLOOD SUGAR TWICE DAILY 100 each 6  . rosuvastatin (CRESTOR) 5 MG tablet TAKE 1 TABLET BY MOUTH EVERY DAY 30 tablet 2   No current facility-administered medications for this visit.     Patient confirms/reports the following allergies:  Allergies  Allergen Reactions  . Shellfish Allergy Shortness Of Breath and Swelling    "throat closing"  . Aspirin Hives  . Penicillins   . Tartrazine Hives    No orders of the defined types were placed in this encounter.   AUTHORIZATION INFORMATION Primary Insurance: 1D#: Group #:  Secondary Insurance: 1D#: Group #:  SCHEDULE INFORMATION: Date: 03/08/18 Time: Location: [facility]

## 2017-09-10 ENCOUNTER — Other Ambulatory Visit: Payer: Self-pay

## 2017-09-14 ENCOUNTER — Other Ambulatory Visit: Payer: Self-pay

## 2017-09-14 DIAGNOSIS — Z1211 Encounter for screening for malignant neoplasm of colon: Secondary | ICD-10-CM

## 2017-09-15 ENCOUNTER — Encounter: Payer: Self-pay | Admitting: Internal Medicine

## 2017-09-28 NOTE — Progress Notes (Signed)
Completed colonoscopy instructions duplicated in error.

## 2017-09-30 ENCOUNTER — Other Ambulatory Visit: Payer: BC Managed Care – PPO

## 2017-11-13 ENCOUNTER — Ambulatory Visit: Payer: BC Managed Care – PPO | Admitting: Internal Medicine

## 2018-01-04 ENCOUNTER — Other Ambulatory Visit: Payer: Self-pay | Admitting: Internal Medicine

## 2018-02-24 ENCOUNTER — Ambulatory Visit: Payer: BC Managed Care – PPO | Admitting: Internal Medicine

## 2018-02-24 VITALS — BP 122/80 | HR 86 | Temp 98.5°F | Resp 18 | Wt 219.0 lb

## 2018-02-24 DIAGNOSIS — E119 Type 2 diabetes mellitus without complications: Secondary | ICD-10-CM

## 2018-02-24 DIAGNOSIS — R7989 Other specified abnormal findings of blood chemistry: Secondary | ICD-10-CM | POA: Diagnosis not present

## 2018-02-24 DIAGNOSIS — I10 Essential (primary) hypertension: Secondary | ICD-10-CM | POA: Diagnosis not present

## 2018-02-24 DIAGNOSIS — T7840XS Allergy, unspecified, sequela: Secondary | ICD-10-CM

## 2018-02-24 DIAGNOSIS — E7800 Pure hypercholesterolemia, unspecified: Secondary | ICD-10-CM | POA: Diagnosis not present

## 2018-02-24 LAB — HEPATIC FUNCTION PANEL
ALBUMIN: 4.3 g/dL (ref 3.5–5.2)
ALK PHOS: 49 U/L (ref 39–117)
ALT: 22 U/L (ref 0–35)
AST: 19 U/L (ref 0–37)
BILIRUBIN TOTAL: 0.8 mg/dL (ref 0.2–1.2)
Bilirubin, Direct: 0.1 mg/dL (ref 0.0–0.3)
Total Protein: 7.1 g/dL (ref 6.0–8.3)

## 2018-02-24 LAB — HEMOGLOBIN A1C: Hgb A1c MFr Bld: 8.4 % — ABNORMAL HIGH (ref 4.6–6.5)

## 2018-02-24 LAB — BASIC METABOLIC PANEL
BUN: 13 mg/dL (ref 6–23)
CHLORIDE: 103 meq/L (ref 96–112)
CO2: 23 meq/L (ref 19–32)
CREATININE: 0.59 mg/dL (ref 0.40–1.20)
Calcium: 9.5 mg/dL (ref 8.4–10.5)
GFR: 111.87 mL/min (ref >60.00)
Glucose, Bld: 141 mg/dL — ABNORMAL HIGH (ref 70–99)
Potassium: 3.9 mEq/L (ref 3.5–5.1)
Sodium: 137 mEq/L (ref 135–145)

## 2018-02-24 LAB — LIPID PANEL
CHOLESTEROL: 135 mg/dL (ref 0–200)
HDL: 35.7 mg/dL — ABNORMAL LOW (ref >39.00)
LDL CALC: 71 mg/dL (ref 0–99)
NONHDL: 98.85
Total CHOL/HDL Ratio: 4
Triglycerides: 140 mg/dL (ref 0.0–149.0)
VLDL: 28 mg/dL (ref 0.0–40.0)

## 2018-02-24 LAB — TSH: TSH: 4.24 u[IU]/mL (ref 0.35–4.50)

## 2018-02-24 NOTE — Progress Notes (Signed)
Patient ID: Caroline Meyers, female   DOB: 06/21/1961, 57 y.o.   MRN: [id number]   Subjective:    Patient ID: Caroline Meyers, female    DOB: [birth year], 57 y.o.   MRN: [id number]  HPI  Patient here for a scheduled follow up. Overall she feels she is doing relatively well.  Had a reaction to seafood one month ago.  Is allergic to shrimp, but has been able to eat oysters and scallops previously.  Noticed after eating seafood lip tingling and did not feel good.  Did not have to use epi pen.  Resolved with benadryl.  Has done well since.  No chest pain.  No sob.  No acid reflux. Bowels have been stable.  Not checking sugars regularly.  Check yesterday am - 150.  Does not check in evening.  Trying to watch her diet.     Past Medical History:  Diagnosis Date  . Achilles tendon tear    age 57  . Environmental allergies   . Hypercholesteremia   . Hyperglycemia   . Hypertension   . Sleep apnea    Past Surgical History:  Procedure Laterality Date  . BREAST BIOPSY Right 2008   cyst removed  . lumbar microdiskectomy  07/03/09   Family History  Problem Relation Age of Onset  . Alzheimer's disease Unknown        grandmother  . Heart disease Unknown        myocardial infarction (32s)- grandfather  . Hypercholesterolemia Mother   . Hypercholesterolemia Sister   . Breast cancer Neg Hx   . Colon cancer Neg Hx    Social History   Socioeconomic History  . Marital status: Married    Spouse name: Not on file  . Number of children: 1  . Years of education: Not on file  . Highest education level: Not on file  Occupational History  . Occupation: Product manager: [employer]  Social Needs  . Financial resource strain: Not on file  . Food insecurity:    Worry: Not on file    Inability: Not on file  . Transportation needs:    Medical: Not on file    Non-medical: Not on file  Tobacco Use  . Smoking status: Never Smoker  . Smokeless tobacco: Never Used  Substance and  Sexual Activity  . Alcohol use: No    Alcohol/week: 0.0 standard drinks  . Drug use: Yes    Types: Hydrocodone  . Sexual activity: Not on file  Lifestyle  . Physical activity:    Days per week: Not on file    Minutes per session: Not on file  . Stress: Not on file  Relationships  . Social connections:    Talks on phone: Not on file    Gets together: Not on file    Attends religious service: Not on file    Active member of club or organization: Not on file    Attends meetings of clubs or organizations: Not on file    Relationship status: Not on file  Other Topics Concern  . Not on file  Social History Narrative  . Not on file    Outpatient Encounter Medications as of 02/24/2018  Medication Sig  . acetaminophen (TYLENOL) 325 MG tablet Take 650 mg by mouth as needed for headache.  Marland Kitchen EPINEPHrine (EPIPEN 2-PAK) 0.3 mg/0.3 mL IJ SOAJ injection Inject 0.3 mLs (0.3 mg total) into the muscle once.  . metFORMIN (GLUCOPHAGE XR) 500  MG 24 hr tablet Take one tablet bid  . ONETOUCH VERIO test strip CHECK BLOOD SUGAR TWICE DAILY  . rosuvastatin (CRESTOR) 5 MG tablet TAKE 1 TABLET BY MOUTH EVERY DAY   No facility-administered encounter medications on file as of 02/24/2018.     Review of Systems  Constitutional: Negative for appetite change and unexpected weight change.  HENT: Negative for congestion and sinus pressure.   Respiratory: Negative for cough, chest tightness and shortness of breath.   Cardiovascular: Negative for chest pain, palpitations and leg swelling.  Gastrointestinal: Negative for abdominal pain, diarrhea, nausea and vomiting.  Genitourinary: Negative for difficulty urinating and dysuria.  Musculoskeletal: Negative for joint swelling and myalgias.  Skin: Negative for color change and rash.  Neurological: Negative for dizziness, light-headedness and headaches.  Psychiatric/Behavioral: Negative for agitation and dysphoric mood.       Objective:    Physical Exam    Constitutional: She appears well-developed and well-nourished. No distress.  HENT:  Nose: Nose normal.  Mouth/Throat: Oropharynx is clear and moist.  Neck: Neck supple. No thyromegaly present.  Cardiovascular: Normal rate and regular rhythm.  Pulmonary/Chest: Breath sounds normal. No respiratory distress. She has no wheezes.  Abdominal: Soft. Bowel sounds are normal. There is no tenderness.  Musculoskeletal: She exhibits no edema or tenderness.  Lymphadenopathy:    She has no cervical adenopathy.  Skin: No rash noted. No erythema.  Psychiatric: She has a normal mood and affect. Her behavior is normal.    BP 122/80 (BP Location: Left Arm, Patient Position: Sitting, Cuff Size: Normal)   Pulse 86   Temp 98.5 F (36.9 C) (Oral)   Resp 18   Wt 219 lb (99.3 kg)   LMP 03/09/2012   SpO2 97%   BMI 40.06 kg/m  Wt Readings from Last 3 Encounters:  02/24/18 219 lb (99.3 kg)  08/12/17 221 lb 6.4 oz (100.4 kg)  06/04/17 222 lb (100.7 kg)     Lab Results  Component Value Date   WBC 7.8 05/19/2017   HGB 15.2 (H) 05/19/2017   HCT 45.4 05/19/2017   PLT 337.0 05/19/2017   GLUCOSE 141 (H) 02/24/2018   CHOL 135 02/24/2018   TRIG 140.0 02/24/2018   HDL 35.70 (L) 02/24/2018   LDLDIRECT 176.7 10/07/2012   LDLCALC 71 02/24/2018   ALT 22 02/24/2018   AST 19 02/24/2018   NA 137 02/24/2018   K 3.9 02/24/2018   CL 103 02/24/2018   CREATININE 0.59 02/24/2018   BUN 13 02/24/2018   CO2 23 02/24/2018   TSH 4.24 02/24/2018   HGBA1C 8.4 (H) 02/24/2018   MICROALBUR 2.4 (H) 05/19/2017    Mm Screening Breast Tomo Bilateral  Result Date: 07/10/2017 CLINICAL DATA:  Screening. EXAM: 2D DIGITAL SCREENING BILATERAL MAMMOGRAM WITH 3D TOMO WITH CAD COMPARISON:  Previous exam(s). ACR Breast Density Category a: The breast tissue is almost entirely fatty. FINDINGS: There are no findings suspicious for malignancy. Images were processed with CAD. IMPRESSION: No mammographic evidence of malignancy. A  result letter of this screening mammogram will be mailed directly to the patient. RECOMMENDATION: Screening mammogram in one year. (Code:SM-B-01Y) BI-RADS CATEGORY  1: Negative. Electronically Signed   By: Nolon Nations M.D.   On: 07/10/2017 10:58       Assessment & Plan:   Problem List Items Addressed This Visit    Diabetes mellitus ([place]) - Primary    Low carb diet and exercise.  Follow met b and a1c.  Discussed diet and exercise with her  today.  On metformin.  Brought in no recorded sugar readings.        Relevant Orders   Hemoglobin A1c (Completed)   Basic metabolic panel (Completed)   Hypercholesterolemia    On crestor.  Low cholesterol diet and exercise.  Follow lipid panel and liver function tests.        Relevant Orders   Hepatic function panel (Completed)   Lipid panel (Completed)   Hypertension    Blood pressure under good control.  Continue same medication regimen.  Follow pressures.  Follow metabolic panel.         Other Visit Diagnoses    Elevated TSH       Relevant Orders   TSH (Completed)   Allergic reaction, sequela       Reaction to shellfish.  Resolved now.  Rx given for Epi pen.         [name], MD

## 2018-03-01 ENCOUNTER — Encounter: Payer: Self-pay | Admitting: Internal Medicine

## 2018-03-01 NOTE — Assessment & Plan Note (Signed)
On crestor.  Low cholesterol diet and exercise.  Follow lipid panel and liver function tests.   

## 2018-03-01 NOTE — Assessment & Plan Note (Signed)
Blood pressure under good control.  Continue same medication regimen.  Follow pressures.  Follow metabolic panel.   

## 2018-03-01 NOTE — Assessment & Plan Note (Signed)
Low carb diet and exercise.  Follow met b and a1c.  Discussed diet and exercise with her today.  On metformin.  Brought in no recorded sugar readings.

## 2018-03-02 ENCOUNTER — Telehealth: Payer: Self-pay | Admitting: Internal Medicine

## 2018-03-02 NOTE — Telephone Encounter (Signed)
Copied from [place] [id number]. Topic: Quick Communication - See Telephone Encounter >> Mar 02, 2018 11:36 AM Sheran Luz wrote: CRM for notification. See Telephone encounter for: 03/02/18.  Pt called requesting to speak with Dr Nicki Reaper or her nurse about getting a prescription for lancets. Pt states that she is completely out. Pt would like those sent to the pharmacy below if possible. Please advise.  [facility] at  [place],  [place] [address]   [phone]    Store [phone]

## 2018-03-03 NOTE — Telephone Encounter (Signed)
LMTCB

## 2018-03-04 ENCOUNTER — Other Ambulatory Visit: Payer: Self-pay

## 2018-03-04 MED ORDER — GLUCOSE BLOOD VI STRP: ORAL_STRIP | 6 refills | Status: AC

## 2018-03-04 MED ORDER — ONETOUCH ULTRASOFT LANCETS MISC: 12 refills | Status: DC

## 2018-03-04 NOTE — Telephone Encounter (Signed)
rx sent in 

## 2018-03-04 NOTE — Telephone Encounter (Signed)
Spoke with patient lancets are verio one touch

## 2018-03-04 NOTE — Telephone Encounter (Signed)
Pt aware and stated that she gave the wrong pharmacy. Rx sent to [facility] as requested

## 2018-03-08 ENCOUNTER — Ambulatory Visit: Payer: BC Managed Care – PPO | Admitting: Anesthesiology

## 2018-03-08 ENCOUNTER — Ambulatory Visit
Admission: RE | Admit: 2018-03-08 | Discharge: 2018-03-08 | Disposition: A | Discharge status: Home / Self Care | Payer: BC Managed Care – PPO | Source: Ambulatory Visit | Attending: Gastroenterology | Admitting: Gastroenterology

## 2018-03-08 ENCOUNTER — Encounter: Payer: Self-pay | Admitting: *Deleted

## 2018-03-08 ENCOUNTER — Encounter
Admission: RE | Disposition: A | Discharge status: Home / Self Care | Payer: Self-pay | Source: Ambulatory Visit | Attending: Gastroenterology

## 2018-03-08 DIAGNOSIS — Z79899 Other long term (current) drug therapy: Secondary | ICD-10-CM | POA: Insufficient documentation

## 2018-03-08 DIAGNOSIS — R739 Hyperglycemia, unspecified: Secondary | ICD-10-CM | POA: Insufficient documentation

## 2018-03-08 DIAGNOSIS — D125 Benign neoplasm of sigmoid colon: Secondary | ICD-10-CM | POA: Insufficient documentation

## 2018-03-08 DIAGNOSIS — Z888 Allergy status to other drugs, medicaments and biological substances status: Secondary | ICD-10-CM | POA: Insufficient documentation

## 2018-03-08 DIAGNOSIS — Z7982 Long term (current) use of aspirin: Secondary | ICD-10-CM | POA: Diagnosis not present

## 2018-03-08 DIAGNOSIS — Z820 Family history of epilepsy and other diseases of the nervous system: Secondary | ICD-10-CM | POA: Diagnosis not present

## 2018-03-08 DIAGNOSIS — Z7984 Long term (current) use of oral hypoglycemic drugs: Secondary | ICD-10-CM | POA: Insufficient documentation

## 2018-03-08 DIAGNOSIS — D123 Benign neoplasm of transverse colon: Secondary | ICD-10-CM | POA: Diagnosis not present

## 2018-03-08 DIAGNOSIS — G4730 Sleep apnea, unspecified: Secondary | ICD-10-CM | POA: Diagnosis not present

## 2018-03-08 DIAGNOSIS — R7303 Prediabetes: Secondary | ICD-10-CM | POA: Diagnosis not present

## 2018-03-08 DIAGNOSIS — Z880 Allergy status to penicillin: Secondary | ICD-10-CM | POA: Insufficient documentation

## 2018-03-08 DIAGNOSIS — Z8249 Family history of ischemic heart disease and other diseases of the circulatory system: Secondary | ICD-10-CM | POA: Diagnosis not present

## 2018-03-08 DIAGNOSIS — I10 Essential (primary) hypertension: Secondary | ICD-10-CM | POA: Diagnosis not present

## 2018-03-08 DIAGNOSIS — D122 Benign neoplasm of ascending colon: Secondary | ICD-10-CM | POA: Diagnosis not present

## 2018-03-08 DIAGNOSIS — Z1211 Encounter for screening for malignant neoplasm of colon: Secondary | ICD-10-CM | POA: Insufficient documentation

## 2018-03-08 DIAGNOSIS — E7800 Pure hypercholesterolemia, unspecified: Secondary | ICD-10-CM | POA: Diagnosis not present

## 2018-03-08 DIAGNOSIS — Z91013 Allergy to seafood: Secondary | ICD-10-CM | POA: Insufficient documentation

## 2018-03-08 DIAGNOSIS — D124 Benign neoplasm of descending colon: Secondary | ICD-10-CM | POA: Diagnosis not present

## 2018-03-08 DIAGNOSIS — F419 Anxiety disorder, unspecified: Secondary | ICD-10-CM | POA: Insufficient documentation

## 2018-03-08 HISTORY — PX: COLONOSCOPY WITH PROPOFOL: SHX5780

## 2018-03-08 HISTORY — DX: Prediabetes: R73.03

## 2018-03-08 LAB — GLUCOSE, CAPILLARY: Glucose-Capillary: 113 mg/dL — ABNORMAL HIGH (ref 70–99)

## 2018-03-08 SURGERY — COLONOSCOPY WITH PROPOFOL
Anesthesia: General

## 2018-03-08 MED ORDER — LIDOCAINE HCL (CARDIAC) PF 100 MG/5ML IV SOSY
PREFILLED_SYRINGE | INTRAVENOUS | Status: DC | PRN
  Administered 2018-03-08: 50 mg via INTRAVENOUS

## 2018-03-08 MED ORDER — LIDOCAINE HCL (PF) 1 % IJ SOLN
2.0000 mL | Freq: Once | INTRAMUSCULAR | Status: AC
  Administered 2018-03-08: 0.3 mL via INTRADERMAL

## 2018-03-08 MED ORDER — SODIUM CHLORIDE 0.9 % IV SOLN
INTRAVENOUS | Status: DC
  Administered 2018-03-08: 1000 mL via INTRAVENOUS

## 2018-03-08 MED ORDER — LIDOCAINE HCL (PF) 1 % IJ SOLN
INTRAMUSCULAR | Status: AC
  Administered 2018-03-08: 0.3 mL via INTRADERMAL
  Filled 2018-03-08: qty 2

## 2018-03-08 MED ORDER — PROPOFOL 500 MG/50ML IV EMUL
INTRAVENOUS | Status: DC | PRN
  Administered 2018-03-08: 175 ug/kg/min via INTRAVENOUS

## 2018-03-08 MED ORDER — PROPOFOL 10 MG/ML IV BOLUS
INTRAVENOUS | Status: DC | PRN
  Administered 2018-03-08: 70 mg via INTRAVENOUS

## 2018-03-08 MED ORDER — PROPOFOL 500 MG/50ML IV EMUL
INTRAVENOUS | Status: AC
  Filled 2018-03-08: qty 50

## 2018-03-08 NOTE — Anesthesia Post-op Follow-up Note (Signed)
Anesthesia QCDR form completed.        

## 2018-03-08 NOTE — H&P (Signed)
Jonathon Bellows, MD [address] [place], [place], [place], [address] [address], [place], [place], [place], [address] Phone: [phone]  Fax: [phone]  Primary Care Physician:  [name], MD   Pre-Procedure History & Physical: HPI:  Caroline Meyers is a 57 y.o. female is here for an colonoscopy.   Past Medical History:  Diagnosis Date  . Achilles tendon tear    age 71  . Environmental allergies   . Hypercholesteremia   . Hyperglycemia   . Hypertension   . Pre-diabetes   . Sleep apnea     Past Surgical History:  Procedure Laterality Date  . BREAST BIOPSY Right 2008   cyst removed  . lumbar microdiskectomy  07/03/09    Prior to Admission medications   Medication Sig Start Date End Date Taking? Authorizing Provider  acetaminophen (TYLENOL) 325 MG tablet Take 650 mg by mouth as needed for headache.    [provider]  EPINEPHrine (EPIPEN 2-PAK) 0.3 mg/0.3 mL IJ SOAJ injection Inject 0.3 mLs (0.3 mg total) into the muscle once. 09/07/15   [name], MD  glucose blood (ONETOUCH VERIO) test strip CHECK BLOOD SUGAR ONCE DAILY 03/04/18   [name], MD  Lancets [facility] ULTRASOFT) lancets Use as instructed to check blood sugars once daily. 03/04/18   [name], MD  metFORMIN (GLUCOPHAGE XR) 500 MG 24 hr tablet Take one tablet bid 06/04/17   [name], MD  rosuvastatin (CRESTOR) 5 MG tablet TAKE 1 TABLET BY MOUTH EVERY DAY 01/08/18   [name], MD    Allergies as of 09/14/2017 - Review Complete 08/15/2017  Allergen Reaction Noted  . Shellfish allergy Shortness Of Breath and Swelling 05/25/2012  . Aspirin Hives 05/25/2012  . Penicillins  05/25/2012  . Tartrazine Hives 11/22/2014    Family History  Problem Relation Age of Onset  . Alzheimer's disease Unknown        grandmother  . Heart disease Unknown        myocardial infarction (38s)- grandfather  . Hypercholesterolemia Mother   . Hypercholesterolemia Sister   . Breast  cancer Neg Hx   . Colon cancer Neg Hx     Social History   Socioeconomic History  . Marital status: Married    Spouse name: Not on file  . Number of children: 1  . Years of education: Not on file  . Highest education level: Not on file  Occupational History  . Occupation: Product manager: [employer]  Social Needs  . Financial resource strain: Not on file  . Food insecurity:    Worry: Not on file    Inability: Not on file  . Transportation needs:    Medical: Not on file    Non-medical: Not on file  Tobacco Use  . Smoking status: Never Smoker  . Smokeless tobacco: Never Used  Substance and Sexual Activity  . Alcohol use: No    Alcohol/week: 0.0 standard drinks  . Drug use: Yes    Types: Hydrocodone  . Sexual activity: Not on file  Lifestyle  . Physical activity:    Days per week: Not on file    Minutes per session: Not on file  . Stress: Not on file  Relationships  . Social connections:    Talks on phone: Not on file    Gets together: Not on file    Attends religious service: Not on file    Active member of club or organization: Not  on file    Attends meetings of clubs or organizations: Not on file    Relationship status: Not on file  . Intimate partner violence:    Fear of current or ex partner: Not on file    Emotionally abused: Not on file    Physically abused: Not on file    Forced sexual activity: Not on file  Other Topics Concern  . Not on file  Social History Narrative  . Not on file    Review of Systems: See HPI, otherwise negative ROS  Physical Exam: BP 128/72   Pulse 95   Temp (!) 96.8 F (36 C) (Tympanic)   Resp 17   Ht 5\' 2"  (1.575 m)   Wt 99.3 kg   LMP 03/09/2012   SpO2 97%   BMI 40.06 kg/m  General:   Alert,  pleasant and cooperative in NAD Head:  Normocephalic and atraumatic. Neck:  Supple; no masses or thyromegaly. Lungs:  Clear throughout to auscultation, normal respiratory effort.    Heart:  +S1,  +S2, Regular rate and rhythm, No edema. Abdomen:  Soft, nontender and nondistended. Normal bowel sounds, without guarding, and without rebound.   Neurologic:  Alert and  oriented x4;  grossly normal neurologically.  Impression/Plan: Caroline Meyers is here for an colonoscopy to be performed for Screening colonoscopy average risk   Risks, benefits, limitations, and alternatives regarding  colonoscopy have been reviewed with the patient.  Questions have been answered.  All parties agreeable.   Jonathon Bellows, MD  03/08/2018, 11:39 AM

## 2018-03-08 NOTE — Anesthesia Preprocedure Evaluation (Signed)
Anesthesia Evaluation  Patient identified by MRN, date of birth, ID band Patient awake    Reviewed: Allergy & Precautions, H&P , NPO status , Patient's Chart, lab work & pertinent test results, reviewed documented beta blocker date and time   Airway Mallampati: II   Neck ROM: full    Dental  (+) Poor Dentition   Pulmonary neg pulmonary ROS, sleep apnea ,    Pulmonary exam normal        Cardiovascular Exercise Tolerance: Good hypertension, On Medications negative cardio ROS Normal cardiovascular exam Rhythm:regular Rate:Normal     Neuro/Psych Anxiety  Neuromuscular disease negative neurological ROS  negative psych ROS   GI/Hepatic negative GI ROS, Neg liver ROS,   Endo/Other  negative endocrine ROSdiabetes, Well Controlled, Type 2, Oral Hypoglycemic Agents  Renal/GU negative Renal ROS  negative genitourinary   Musculoskeletal   Abdominal   Peds  Hematology negative hematology ROS (+)   Anesthesia Other Findings Past Medical History: No date: Achilles tendon tear     Comment:  age 57 No date: Environmental allergies No date: Hypercholesteremia No date: Hyperglycemia No date: Hypertension No date: Pre-diabetes No date: Sleep apnea Past Surgical History: 2008: BREAST BIOPSY; Right     Comment:  cyst removed 07/03/09: lumbar microdiskectomy   Reproductive/Obstetrics negative OB ROS                             Anesthesia Physical Anesthesia Plan  ASA: III  Anesthesia Plan: General   Post-op Pain Management:    Induction:   PONV Risk Score and Plan:   Airway Management Planned:   Additional Equipment:   Intra-op Plan:   Post-operative Plan:   Informed Consent: I have reviewed the patients History and Physical, chart, labs and discussed the procedure including the risks, benefits and alternatives for the proposed anesthesia with the patient or authorized representative who  has indicated his/her understanding and acceptance.   Dental Advisory Given  Plan Discussed with: CRNA  Anesthesia Plan Comments:         Anesthesia Quick Evaluation

## 2018-03-08 NOTE — Op Note (Signed)
[facility] Gastroenterology Patient Name: Caroline Meyers Procedure Date: 03/08/2018 11:41 AM MRN: [id number] Account #: [id number] Date of Birth: [birth year] Admit Type: Outpatient Age: 57 Room: [facility] ENDO ROOM 4 Gender: Female Note Status: Finalized Procedure:            Colonoscopy Indications:          Screening for colorectal malignant neoplasm Providers:            Jonathon Bellows MD, MD Referring MD:         [name], MD (Referring MD) Medicines:            Monitored Anesthesia Care Complications:        No immediate complications. Procedure:            Pre-Anesthesia Assessment:                       - Prior to the procedure, a History and Physical was                        performed, and patient medications, allergies and                        sensitivities were reviewed. The patient's tolerance of                        previous anesthesia was reviewed.                       - The risks and benefits of the procedure and the                        sedation options and risks were discussed with the                        patient. All questions were answered and informed                        consent was obtained.                       - ASA Grade Assessment: II - A patient with mild                        systemic disease.                       After obtaining informed consent, the colonoscope was                        passed under direct vision. Throughout the procedure,                        the patient's blood pressure, pulse, and oxygen                        saturations were monitored continuously. The                        Colonoscope was introduced through the anus and  advanced to the the cecum, identified by the                        appendiceal orifice, IC valve and transillumination.                        The colonoscopy was performed with ease. The patient                        tolerated the procedure well. The quality  of the bowel                        preparation was excellent. Findings:      The perianal and digital rectal examinations were normal.      A 10 mm polyp was found in the ascending colon. The polyp was sessile.       The polyp was removed with a cold snare. Resection and retrieval were       complete. To prevent bleeding after the polypectomy, three hemostatic       clips were successfully placed. There was no bleeding during, or at the       end, of the procedure.      A 7 mm polyp was found in the ascending colon. The polyp was sessile.       The polyp was removed with a cold snare. Resection and retrieval were       complete.      Two sessile polyps were found in the descending colon. The polyps were 5       to 7 mm in size. These polyps were removed with a cold snare. Resection       and retrieval were complete.      A 10 mm polyp was found in the sigmoid colon. The polyp was sessile. The       polyp was removed with a hot snare. Resection and retrieval were       complete.      The exam was otherwise without abnormality.      The exam was otherwise without abnormality on direct and retroflexion       views. Impression:           - One 10 mm polyp in the ascending colon, removed with                        a cold snare. Resected and retrieved. Clips were placed.                       - One 7 mm polyp in the ascending colon, removed with a                        cold snare. Resected and retrieved.                       - Two 5 to 7 mm polyps in the descending colon, removed                        with a cold snare. Resected and retrieved.                       - One 10 mm polyp in the sigmoid  colon, removed with a                        hot snare. Resected and retrieved.                       - The examination was otherwise normal.                       - The examination was otherwise normal on direct and                        retroflexion views. Recommendation:       - Discharge  patient to home (with escort).                       - Resume previous diet.                       - Continue present medications.                       - Await pathology results.                       - Repeat colonoscopy in 3 years for surveillance. Procedure Code(s):    --- Professional ---                       [id number], Colonoscopy, flexible; with removal of tumor(s),                        polyp(s), or other lesion(s) by snare technique Diagnosis Code(s):    --- Professional ---                       Z12.11, Encounter for screening for malignant neoplasm                        of colon                       D12.2, Benign neoplasm of ascending colon                       D12.5, Benign neoplasm of sigmoid colon                       D12.4, Benign neoplasm of descending colon CPT copyright 2017 American Medical Association. All rights reserved. The codes documented in this report are preliminary and upon coder review may  be revised to meet current compliance requirements. Jonathon Bellows, MD Jonathon Bellows MD, MD 03/08/2018 12:14:45 PM This report has been signed electronically. Number of Addenda: 0 Note Initiated On: 03/08/2018 11:41 AM Scope Withdrawal Time: 0 hours 17 minutes 52 seconds  Total Procedure Duration: 0 hours 19 minutes 29 seconds       [facility]

## 2018-03-08 NOTE — Transfer of Care (Signed)
Immediate Anesthesia Transfer of Care Note  Patient: Caroline Meyers  Procedure(s) Performed: COLONOSCOPY WITH PROPOFOL (N/A )  Patient Location: PACU  Anesthesia Type:General  Level of Consciousness: sedated  Airway & Oxygen Therapy: Patient Spontanous Breathing and Patient connected to nasal cannula oxygen  Post-op Assessment: Report given to RN and Post -op Vital signs reviewed and stable  Post vital signs: Reviewed and stable  Last Vitals:  Vitals Value Taken Time  BP 117/73 03/08/2018 12:18 PM  Temp 36.5 C 03/08/2018 12:18 PM  Pulse 86 03/08/2018 12:18 PM  Resp 18 03/08/2018 12:18 PM  SpO2 94 % 03/08/2018 12:18 PM    Last Pain:  Vitals:   03/08/18 1218  TempSrc: Tympanic  PainSc:          Complications: No apparent anesthesia complications

## 2018-03-09 ENCOUNTER — Encounter: Payer: Self-pay | Admitting: Gastroenterology

## 2018-03-10 LAB — SURGICAL PATHOLOGY

## 2018-03-14 ENCOUNTER — Encounter: Payer: Self-pay | Admitting: Gastroenterology

## 2018-03-15 ENCOUNTER — Encounter: Payer: Self-pay | Admitting: Internal Medicine

## 2018-03-16 ENCOUNTER — Other Ambulatory Visit: Payer: Self-pay

## 2018-03-16 MED ORDER — BLOOD GLUCOSE METER KIT: PACK | 0 refills | Status: DC

## 2018-03-16 NOTE — Anesthesia Postprocedure Evaluation (Signed)
Anesthesia Post Note  Patient: Domenic Schwab  Procedure(s) Performed: COLONOSCOPY WITH PROPOFOL (N/A )  Patient location during evaluation: PACU Anesthesia Type: General Level of consciousness: awake and alert Pain management: pain level controlled Vital Signs Assessment: post-procedure vital signs reviewed and stable Respiratory status: spontaneous breathing, nonlabored ventilation, respiratory function stable and patient connected to nasal cannula oxygen Cardiovascular status: blood pressure returned to baseline and stable Postop Assessment: no apparent nausea or vomiting Anesthetic complications: no     Last Vitals:  Vitals:   03/08/18 1218 03/08/18 1226  BP: 117/73 137/79  Pulse: 94   Resp: 20   Temp: (!) 36.1 C   SpO2: 94%     Last Pain:  Vitals:   03/09/18 0746  TempSrc:   PainSc: 0-No pain                 Molli Barrows

## 2018-03-29 ENCOUNTER — Other Ambulatory Visit: Payer: Self-pay | Admitting: Internal Medicine

## 2018-03-29 ENCOUNTER — Other Ambulatory Visit: Payer: Self-pay

## 2018-03-29 MED ORDER — ONETOUCH ULTRASOFT LANCETS MISC: 12 refills | Status: DC

## 2018-05-03 ENCOUNTER — Telehealth: Payer: Self-pay

## 2018-05-03 ENCOUNTER — Telehealth: Payer: Self-pay | Admitting: Internal Medicine

## 2018-05-03 NOTE — Telephone Encounter (Signed)
Can you help with this.  It appears that Dr Vicente Males did her colonoscopy.  [facility] are with [facility].  Not out of network to my knowledge.

## 2018-05-03 NOTE — Telephone Encounter (Signed)
Copied from [place] [id number]. Topic: Quick Communication - Rx Refill/Question >> May 03, 2018  2:50 PM Reyne Dumas L wrote: Medication: metFORMIN (GLUCOPHAGE-XR) 500 MG 24 hr tablet  Pt states that she was taking this two pills in the morning and two pills in the evening.  Pt states that the script is written to take one pill twice daily.   Pt is confused about how she should be taking this medication.    Has the patient contacted their pharmacy? No - change in medication (Agent: If no, request that the patient contact the pharmacy for the refill.) (Agent: If yes, when and what did the pharmacy advise?)  Preferred Pharmacy (with phone number or street name): [id number] [address], [place] [phone] (Phone) [phone] (Fax)  Agent: Please be advised that RX refills may take up to 3 business days. We ask that you follow-up with your pharmacy.

## 2018-05-03 NOTE — Telephone Encounter (Signed)
Contacted pt regarding request; she said that when she saw Dr Nicki Reaper, the doctor wanted her to increase her medication dose; she says that her blood sugars have been up and done, and she has not taken them often; the pt says that she started taking 2 pills twice daily after 02/24/18; she says that she went back to one tablet twice daily 2 weeks ago; the pt says that she has been inconsistent with taking this dose also; her last office visit was 02/24/18; the pt states that she does not want to come in because she has regular tablets left; explained to pt that there is a difference between regular release and extended release medication; the pt would like a call back to discuss this; her contact number is [phone]; will route to office for review.

## 2018-05-03 NOTE — Telephone Encounter (Signed)
Copied from [place] [id number]. Topic: Complaint - Billing/Coding >> May 03, 2018  2:53 PM Reyne Dumas L wrote: Pt states she has already spoken with billing and was told to contact office. Pt was sent for a colonoscopy, but she was referred to someone who was out of her network and she wants to know why that was done. Pt states she was never told there would be a charge for her colonoscopy and she wants to speak with the PCP to know why she was referred like she was. Pt can be reached at [phone]

## 2018-05-03 NOTE — Telephone Encounter (Signed)
Pt has not been compliant taking her sugars. She thought that she was told to increase her metformin to 2 tabs BID. When she got her rx the directions was 1 tablet bid. Patient is confused and would like to know how you would like for her to take her medication. She does not want to come in for an office visit due to insurance reasons. She was not having any issues taking the metformin 2 tablets BID.

## 2018-05-04 ENCOUNTER — Other Ambulatory Visit: Payer: Self-pay | Admitting: Internal Medicine

## 2018-05-04 MED ORDER — METFORMIN HCL ER 500 MG PO TB24: ORAL_TABLET | ORAL | 1 refills | Status: DC

## 2018-05-04 NOTE — Progress Notes (Signed)
rx sent in for metformin XR 2 tablets bid #360 with one refill to [facility].  Message sent to cancel rx sent to [facility].

## 2018-05-04 NOTE — Telephone Encounter (Signed)
I reviewed chart.  Per 02/25/18 lab result note pt was instructed to increase to metformin XR 500mg  2 tablets bid.  It does not appear the medication on her med list was changed and a new rx (with new instructions) was not sent in.  She is correct, she is supposed to be on 2 bid (metformin XR 500mg ).  I sent in rx to [place] - because this was the pharmacy we had in her chart.  Per phone message, wanted rx sent to [facility].  [facility]  Please cancel rx to [place].

## 2018-05-04 NOTE — Progress Notes (Signed)
rx sent in for metformin XR 2 tablets bid #360 with one refill.  I sent in rx to [facility].

## 2018-05-04 NOTE — Telephone Encounter (Signed)
Pt aware. Rx cancelled

## 2018-05-05 NOTE — Telephone Encounter (Signed)
I returned the patients call after speaking with her insurance carrier to inform her that her bill is a 70/30 plan leaving her responsible for her bill . Caroline Meyers from the pre service center also talked with the patient in reference to this subject. She will have her boss to call and speak to the patient. The patient stated her dissatisfaction in not be able to resolve her not having to pay such a extreme amount for her procedure.

## 2018-05-17 ENCOUNTER — Ambulatory Visit: Payer: Self-pay | Admitting: *Deleted

## 2018-05-17 NOTE — Telephone Encounter (Signed)
appt 05/18/18

## 2018-05-17 NOTE — Telephone Encounter (Signed)
Contacted pt regarding her symptoms; "Pt noting her middle toe on right foot has redness and swelling around the toe nail. She said she is not in pain. She can bend it. She is asking if appt needed. If so, with Dr. Nicki Reaper or dermatology?"; the pt says for the past 5 days her toe and underneath her toe nail is red and swollen she also says that this toenail is also darker than the rest; the pt is diabetic; she says that she took her toe nail polish off and noticed that the toe was more red on 05/16/18; she is not sure if she stomped or injured her toe or not; she also states that may she clipped her toenail too short but her symptoms started prior to clipping her toe nails on 05/16/18;  recommendations made per nurse triage protocol; the pt normally sees Dr Nicki Reaper, but she has no availability within the parameters per guideline; pt offered and accepted appointment with Philis Nettle, [place], 05/18/18 at 1540; she verbalized understanding; will route to office for notification of this upcoming appointment.  Reason for Disposition . [1] Ulcer, wound, blister, sore, or red area AND [2] new or increasing  Answer Assessment - Initial Assessment Questions 1. SYMPTOM: "What's the main symptom you're concerned about?" (e.g., rash, sore, callus, drainage, numbness)     Right middle toe reddened; redness and discoloration under toe nail; 2. LOCATION: "Where is the located?" (e.g., foot/toe, top/bottom, left/right)     Right foot middle toe 3. ONSET: "When did the    start?"     05/12/18 4. PAIN: "Is there any pain?" If so, ask: "How bad is it?" (Scale: 1-10; mild, moderate, severe)     no 5. CAUSE: "What do you think is causing the symptoms?"     Not sure 6. OTHER SYMPTOMS: "Do you have any other symptoms?" (e.g., fever, weakness)     no 7. PREGNANCY: "Is there any chance you are pregnant?" "When was your last menstrual period?"     No menopause  Protocols used: DIABETES - FOOT PROBLEMS AND  QUESTIONS-A-AH

## 2018-05-17 NOTE — Telephone Encounter (Signed)
FYI This Pt will be coming to see you 11/19 Pt stated her middle toe on right foot has redness and swelling around the toe nail. She said she is not in pain. She can bend it. The pt says for the past 5 days her toe and underneath her toe nail is red and swollen she also says that this toenail is also darker than the rest; the pt is diabetic; she says that she took her toe nail polish off and noticed that the toe was more red on 05/16/18; she is not sure if she stomped or injured her toe or not; she also states that may she clipped her toenail too short but her symptoms started prior to clipping her toe nails on 05/16/18;   the pt normally sees Dr Nicki Reaper, but she has no availability within the parameters per guideline; pt offered and accepted appointment with Philis Nettle, [place], 05/18/18 at 1540; she verbalized understanding; will route to office for notification of this upcoming appointment.

## 2018-05-17 NOTE — Telephone Encounter (Signed)
FYI

## 2018-05-18 ENCOUNTER — Ambulatory Visit: Payer: BC Managed Care – PPO | Admitting: Family Medicine

## 2018-05-18 ENCOUNTER — Encounter: Payer: Self-pay | Admitting: Family Medicine

## 2018-05-18 VITALS — BP 132/88 | HR 86 | Temp 98.6°F | Ht 62.0 in | Wt 225.6 lb

## 2018-05-18 DIAGNOSIS — L03115 Cellulitis of right lower limb: Secondary | ICD-10-CM

## 2018-05-18 MED ORDER — SULFAMETHOXAZOLE-TRIMETHOPRIM 800-160 MG PO TABS: 1.0000 | ORAL_TABLET | Freq: Two times a day (BID) | ORAL | 0 refills | Status: DC

## 2018-05-18 NOTE — Progress Notes (Signed)
   Subjective:    Patient ID: Caroline Meyers, female    DOB: [birth year], 57 y.o.   MRN: [id number]  HPI  Presents to clinic c/o red right 3rd toe over the last week.  The area is not painful, but the redness has increased, and skin feels hot. No fever or chills. She is a diabetic, last A1c in august 2019 was 8.4%.  States she did have a pedicure at the nail salon about 3 weeks ago - wonders if they cut her nail/cuticle skin too deep.   Patient Active Problem List   Diagnosis Date Noted  . Health care maintenance 04/25/2016  . Anxiety and depression 09/01/2015  . Low back pain with sciatica 11/26/2012  . Sleep apnea 05/26/2012  . Diabetes mellitus ([place]) 05/26/2012  . Hypercholesterolemia 05/26/2012  . Hypertension 05/26/2012   Social History   Tobacco Use  . Smoking status: Never Smoker  . Smokeless tobacco: Never Used  Substance Use Topics  . Alcohol use: No    Alcohol/week: 0.0 standard drinks   Review of Systems  Constitutional: Negative for chills, fatigue and fever.  HENT: Negative for congestion, ear pain, sinus pain and sore throat.   Eyes: Negative.   Respiratory: Negative for cough, shortness of breath and wheezing.   Cardiovascular: Negative for chest pain, palpitations and leg swelling.  Gastrointestinal: Negative for abdominal pain, diarrhea, nausea and vomiting.  Genitourinary: Negative for dysuria, frequency and urgency.  Musculoskeletal: Negative for arthralgias and myalgias.  Skin: +redness right 3rd toe Neurological: Negative for syncope, light-headedness and headaches.  Psychiatric/Behavioral: The patient is not nervous/anxious.       Objective:   Physical Exam  Constitutional: She is oriented to person, place, and time. No distress.  HENT:  Head: Normocephalic and atraumatic.  Eyes: Conjunctivae and EOM are normal. No scleral icterus.  Neck: Neck supple. No tracheal deviation present.  Cardiovascular: Normal rate and regular rhythm.  Pulmonary/Chest:  Effort normal and breath sounds normal.  Musculoskeletal:       Feet:  Area of redness on right 3rd toe indicated by red mark on diagram.   Neurological: She is alert and oriented to person, place, and time.  Skin: Skin is warm and dry. There is erythema. No pallor.  Psychiatric: She has a normal mood and affect. Her behavior is normal.  Nursing note and vitals reviewed.     Vitals:   05/18/18 1556  BP: 132/88  Pulse: 86  Temp: 98.6 F (37 C)  SpO2: 95%    Assessment & Plan:    Cellulitis right third toe-patient will take Bactrim twice daily for 10 days.  Patient is penicillin allergic, so this is why Keflex was not chosen.  Patient advised to try and keep skin as clean and dry as possible, sometimes our toes becomes sweaty and this could make the skin on her feet more susceptible to breaking open and infection.  Patient advised to monitor for any worsening redness, development of fever, drainage from the toe and if any of these things occur she is to call office right away.  Keep regularly scheduled follow-up with PCP as planned.  Return to clinic sooner if new issues arise or if current symptoms persist or worsen.

## 2018-05-19 ENCOUNTER — Encounter: Payer: Self-pay | Admitting: Family Medicine

## 2018-06-02 ENCOUNTER — Encounter: Payer: Self-pay | Admitting: Internal Medicine

## 2018-06-02 DIAGNOSIS — E119 Type 2 diabetes mellitus without complications: Secondary | ICD-10-CM

## 2018-06-02 DIAGNOSIS — L089 Local infection of the skin and subcutaneous tissue, unspecified: Secondary | ICD-10-CM

## 2018-06-02 NOTE — Telephone Encounter (Signed)
Patient would like podiatry referral

## 2018-06-02 NOTE — Telephone Encounter (Signed)
Given that she is diabetic and given persistent pain, etc, I would recommend podiatry evaluation.  I can work her in here for reevaluation, but podiatry would probably save her two visits.  If agreeable, let me know and I will place the order for the referral.

## 2018-06-02 NOTE — Telephone Encounter (Signed)
Order placed for podiatry referral.   

## 2018-06-07 NOTE — Telephone Encounter (Signed)
Caroline Meyers.  Can you help with this.  She is having increased pain - toe.  Is a diabetic.  Can we get an appt asap.  See her my chart message.  Thanks

## 2018-06-10 ENCOUNTER — Ambulatory Visit (INDEPENDENT_AMBULATORY_CARE_PROVIDER_SITE_OTHER): Payer: BC Managed Care – PPO

## 2018-06-10 ENCOUNTER — Ambulatory Visit: Payer: BC Managed Care – PPO | Admitting: Podiatry

## 2018-06-10 ENCOUNTER — Encounter: Payer: Self-pay | Admitting: Podiatry

## 2018-06-10 ENCOUNTER — Other Ambulatory Visit: Payer: Self-pay | Admitting: Podiatry

## 2018-06-10 VITALS — BP 165/96 | HR 99

## 2018-06-10 DIAGNOSIS — E119 Type 2 diabetes mellitus without complications: Secondary | ICD-10-CM

## 2018-06-10 DIAGNOSIS — L03115 Cellulitis of right lower limb: Secondary | ICD-10-CM | POA: Diagnosis not present

## 2018-06-10 DIAGNOSIS — M7751 Other enthesopathy of right foot: Secondary | ICD-10-CM

## 2018-06-10 DIAGNOSIS — M19071 Primary osteoarthritis, right ankle and foot: Secondary | ICD-10-CM

## 2018-06-10 DIAGNOSIS — L03031 Cellulitis of right toe: Secondary | ICD-10-CM | POA: Diagnosis not present

## 2018-06-10 MED ORDER — SULFAMETHOXAZOLE-TRIMETHOPRIM 800-160 MG PO TABS: 1.0000 | ORAL_TABLET | Freq: Two times a day (BID) | ORAL | 0 refills | Status: DC

## 2018-06-10 NOTE — Progress Notes (Signed)
His patient presents the office with chief complaint of a painful third toe, right foot. She says that it developed approximately 3 weeks ago when it became red and swollen and painful.  She was seen by her medical doctor on 1129 who diagnosed a cellulitis and treated her with Bactrim.  She says she has finished the antibiotics but that the toe is becoming increasingly red and increasingly swollen.  She says she is experiences pain especially when she wears her closed in shoes.  She denies any history of trauma or injury to the foot.  She denies any history of arthritic conditions.  She presents the office today for an evaluation and treatment of her third toe, right foot.  This patient is diabetic.  Vascular  Dorsalis pedis and posterior tibial pulses are palpable  B/L.  Capillary return  WNL.  Temperature gradient is  WNL.  Skin turgor  WNL  Sensorium  Senn Weinstein monofilament wire  WNL. Normal tactile sensation.  Nail Exam  Patient has normal nails with no evidence of bacterial or fungal infection. Patient has a third toe of the right foot that is unattached to the nailbed.  No evidence of any pus or drainage noted.  Orthopedic  Exam  Muscle tone and muscle strength  WNL.  No limitations of motion feet  B/L.  No crepitus or joint effusion noted.  Foot type is unremarkable and digits show no abnormalities.  Bony prominences are unremarkable.  Skin  No open lesions.  Normal skin texture and turgor. Redness and swelling noted extending distally from DIPJ third toe right foot.  No streaking noted from third toe right foot.  Nail Dystrophy right third toe.  Paronychia third toe right foot.    IE.  X-rays were taken reveal no evidence of any bony or arthritic pathology third toe, right foot.  Discussed this condition with this patient.  Told her that due to the fact that her nail plate is unattached to the nailbed. There is constant motion  to this unattached nail plate  and redness ensued.  This  process has been occurring for 3 weeks.  Prescribe bactrim due to antibiotic allergies.  Home soaks were prescribed.  RTC 10 days.   Gardiner Barefoot DPM

## 2018-06-18 ENCOUNTER — Ambulatory Visit: Payer: BC Managed Care – PPO | Admitting: Internal Medicine

## 2018-06-21 ENCOUNTER — Ambulatory Visit: Payer: BC Managed Care – PPO | Admitting: Podiatry

## 2018-07-01 ENCOUNTER — Telehealth: Payer: Self-pay | Admitting: Podiatry

## 2018-07-01 NOTE — Telephone Encounter (Signed)
I returned patient call, she wanted to know if some pink/redness was normal after total nail avulsion.  I informed her that it was normal for the toe to be slightly red at times, tender to touch.  I informed her to start soaking in Epson salt twice daily and use a small amount of ABT ointment, and educated her on s/s of infection.  She verbalized understanding and she will call back or go to ER/urgent care if gets worse.

## 2018-07-01 NOTE — Telephone Encounter (Signed)
Pt had toenail removed 12/12 and cancelled follow up because nail was doing better. Toe is now swollen and red again and patient wants to know if this is normal. Please give pt a call.

## 2018-07-07 ENCOUNTER — Other Ambulatory Visit: Payer: Self-pay | Admitting: Internal Medicine

## 2018-08-19 ENCOUNTER — Encounter: Payer: Self-pay | Admitting: Podiatry

## 2018-08-19 ENCOUNTER — Ambulatory Visit: Payer: BC Managed Care – PPO | Admitting: Podiatry

## 2018-08-19 DIAGNOSIS — M129 Arthropathy, unspecified: Secondary | ICD-10-CM

## 2018-08-19 DIAGNOSIS — E119 Type 2 diabetes mellitus without complications: Secondary | ICD-10-CM | POA: Diagnosis not present

## 2018-08-19 MED ORDER — MELOXICAM 15 MG PO TABS: 15.0000 mg | ORAL_TABLET | Freq: Every day | ORAL | 0 refills | Status: DC

## 2018-08-19 NOTE — Progress Notes (Signed)
This patient presents to the office for continued evaluation of her third toe right foot.  She says since her last visit this toe has been days better some days and  worse other days.  She says there is no evidence of any drainage from the toe.  She says the Bactrim did not help relieve the redness in her third toe.  She presents the office today wearing shoes that are cut out at her toes.  She was initially seen by her medical doctor who treated her for an infection with Bactrim.  She presented to the office on December 12 at which time an x-ray was taken revealing no arthritic changes through the joints of the third toe.  I did proceed to remove an offending unattached nail plate.  She has now been soaking and bandaging her toe but it has not completely resolved.  She presents the office today for an evaluation and treatment of his third toe right foot. Patient is a diabetic.  Vascular  Dorsalis pedis and posterior tibial pulses are palpable  B/L.  Capillary return  WNL.  Temperature gradient is  WNL.  Skin turgor  WNL  Sensorium  Senn Weinstein monofilament wire  WNL. Normal tactile sensation.  Nail Exam  Patient has normal nails with no evidence of bacterial or fungal infection. Dystrophic nail plate third toe right foot.  No infection or pus.  Orthopedic  Exam  Muscle tone and muscle strength  WNL.  No limitations of motion feet  B/L.  No crepitus or joint effusion noted.  Examination of her third toe reveals a very thick inflamed third toe extending from the DIPJ distally to the tip of the toe.    Skin  No open lesions.  Normal skin texture and turgor.    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  ROV.  Reevaluation of her third toe reveals no evidence of any persistent infection.  A review of the x-rays taken in December does reveal white cloudiness in the soft tissue surrounding the DIPJ third toe right.  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  I  recommended we prescribe her Mobic to be taken daily.  We also discussed sending her for blood work and an arthritis profile.  Patient desires to hold hold off on blood work today.  RTC prn   Gardiner Barefoot DPM

## 2018-09-10 ENCOUNTER — Other Ambulatory Visit: Payer: Self-pay | Admitting: Podiatry

## 2018-10-19 ENCOUNTER — Other Ambulatory Visit: Payer: Self-pay | Admitting: Podiatry

## 2019-01-24 ENCOUNTER — Other Ambulatory Visit: Payer: Self-pay | Admitting: Internal Medicine

## 2019-02-06 ENCOUNTER — Other Ambulatory Visit: Payer: Self-pay | Admitting: Internal Medicine

## 2019-02-08 NOTE — Telephone Encounter (Signed)
Attempted to reach pt. Needs appt

## 2019-03-04 NOTE — Telephone Encounter (Signed)
Called pt  Unable to leave message

## 2019-03-14 ENCOUNTER — Encounter: Payer: Self-pay | Admitting: Internal Medicine

## 2019-03-15 ENCOUNTER — Ambulatory Visit (INDEPENDENT_AMBULATORY_CARE_PROVIDER_SITE_OTHER): Payer: BC Managed Care – PPO | Admitting: Internal Medicine

## 2019-03-15 ENCOUNTER — Other Ambulatory Visit: Payer: Self-pay

## 2019-03-15 ENCOUNTER — Other Ambulatory Visit: Payer: Self-pay | Admitting: Internal Medicine

## 2019-03-15 DIAGNOSIS — E119 Type 2 diabetes mellitus without complications: Secondary | ICD-10-CM

## 2019-03-15 DIAGNOSIS — E7800 Pure hypercholesterolemia, unspecified: Secondary | ICD-10-CM

## 2019-03-15 DIAGNOSIS — I10 Essential (primary) hypertension: Secondary | ICD-10-CM

## 2019-03-15 DIAGNOSIS — H60331 Swimmer's ear, right ear: Secondary | ICD-10-CM

## 2019-03-15 DIAGNOSIS — F329 Major depressive disorder, single episode, unspecified: Secondary | ICD-10-CM

## 2019-03-15 DIAGNOSIS — F419 Anxiety disorder, unspecified: Secondary | ICD-10-CM

## 2019-03-15 MED ORDER — NEOMYCIN-POLYMYXIN-HC 3.5-10000-1 OT SOLN: 4.0000 [drp] | Freq: Four times a day (QID) | OTIC | 0 refills | Status: DC

## 2019-03-15 NOTE — Progress Notes (Signed)
rx sent in for cortisporin otic

## 2019-03-15 NOTE — Telephone Encounter (Signed)
Pt scheduled  

## 2019-03-16 NOTE — Progress Notes (Signed)
Patient ID: Caroline Meyers, female   DOB: 08-10-1960, 58 y.o.   MRN: [id number]   Virtual Visit via video Note  This visit type was conducted due to national recommendations for restrictions regarding the COVID-19 pandemic (e.g. social distancing).  This format is felt to be most appropriate for this patient at this time.  All issues noted in this document were discussed and addressed.  No physical exam was performed (except for noted visual exam findings with Video Visits).   I connected with Caroline Meyers by a video enabled telemedicine application and verified that I am speaking with the correct person using two identifiers. Location patient: home Location provider: work  Persons participating in the virtual visit: patient, provider  I discussed the limitations, risks, security and privacy concerns of performing an evaluation and management service by video and the availability of in person appointments. The patient expressed understanding and agreed to proceed.   Reason for visit: work in appt.  [place]  Noticed 6-7 days ago, ear fullness.  Feels like ear is closed - canal feels like is closed/swollen.  No pain.  Has been using H2O2.  Also using medication for swimmer's ear.  Some better, but still with persistent symptoms.  Using heating pad and taking ibuprofen.  She retired 12/2018.  Stress is better.  Plans to still come her for her care (at this time).  Discussed the need for labs.     ROS: See pertinent positives and negatives per HPI.  Past Medical History:  Diagnosis Date  . Achilles tendon tear    age 28  . Environmental allergies   . Hypercholesteremia   . Hyperglycemia   . Hypertension   . Pre-diabetes   . Sleep apnea     Past Surgical History:  Procedure Laterality Date  . BREAST BIOPSY Right 2008   cyst removed  . COLONOSCOPY WITH PROPOFOL N/A 03/08/2018   Procedure: COLONOSCOPY WITH PROPOFOL;  Surgeon: Jonathon Bellows, MD;  Location: [facility]  ENDOSCOPY;  Service: Gastroenterology;  Laterality: N/A;  . lumbar microdiskectomy  07/03/09    Family History  Problem Relation Age of Onset  . Alzheimer's disease Other        grandmother  . Heart disease Other        myocardial infarction (5s)- grandfather  . Hypercholesterolemia Mother   . Hypercholesterolemia Sister   . Breast cancer Neg Hx   . Colon cancer Neg Hx     SOCIAL HX: reviewed.    Current Outpatient Medications:  .  acetaminophen (TYLENOL) 325 MG tablet, Take 650 mg by mouth as needed for headache., Disp: , Rfl:  .  blood glucose meter kit and supplies, Dispense based on patient and insurance preference. Use up to four times daily as directed. (FOR ICD-10 E10.9, E11.9)., Disp: 1 each, Rfl: 0 .  EPINEPHrine (EPIPEN 2-PAK) 0.3 mg/0.3 mL IJ SOAJ injection, Inject 0.3 mLs (0.3 mg total) into the muscle once., Disp: 1 Device, Rfl: 1 .  glucose blood (ONETOUCH VERIO) test strip, CHECK BLOOD SUGAR ONCE DAILY, Disp: 100 each, Rfl: 6 .  Lancets (ONETOUCH ULTRASOFT) lancets, Use as instructed to check blood sugars once daily., Disp: 100 each, Rfl: 12 .  Lancets (ONETOUCH ULTRASOFT) lancets, Use as instructed. Use to check blood sugars once daily as directed. Dispense ONE TOUCH VEROFLEX LANCETS., Disp: 100 each, Rfl: 12 .  meloxicam (MOBIC) 15 MG tablet, TAKE 1 TABLET BY MOUTH EVERY DAY, Disp: 30 tablet, Rfl: 0 .  metFORMIN (GLUCOPHAGE-XR) 500 MG 24 hr tablet, TAKE 2 TABLETS BY MOUTH TWICE A DAY, Disp: 360 tablet, Rfl: 0 .  neomycin-polymyxin-hydrocortisone (CORTISPORIN) OTIC solution, Place 4 drops into the right ear 4 (four) times daily., Disp: 10 mL, Rfl: 0 .  rosuvastatin (CRESTOR) 5 MG tablet, TAKE 1 TABLET BY MOUTH EVERY DAY, Disp: 90 tablet, Rfl: 0 .  sulfamethoxazole-trimethoprim (BACTRIM DS,SEPTRA DS) 800-160 MG tablet, Take 1 tablet by mouth 2 (two) times daily., Disp: 20 tablet, Rfl: 0  EXAM:  GENERAL: alert, oriented, appears well and in no acute distress  HEENT:  atraumatic, conjunttiva clear, no obvious abnormalities on inspection of external nose.  No outer swelling of ear.  No redness.    NECK: normal movements of the head and neck  LUNGS: on inspection no signs of respiratory distress, breathing rate appears normal, no obvious gross SOB, gasping or wheezing  CV: no obvious cyanosis  PSYCH/NEURO: pleasant and cooperative, no obvious depression or anxiety, speech and thought processing grossly intact  ASSESSMENT AND PLAN:  Discussed the following assessment and plan:  Anxiety and depression Stress is better.  Does not feel needs any further intervention.  Follow.    Diabetes mellitus ([place]) Low carb diet and exercise.  Overdue labs.  Needs met b and a1c.  Needs regular eye exams.    Hypercholesterolemia On crestor.  Low cholesterol diet and exercise.  Follow lipid panel and liver function tests.    Hypertension Blood pressure has been under good control.  Follow pressures.  Follow metabolic panel.   Swimmer's ear Question of swimmer's ear.  Treat with cortisporin otic.  Discussed limitations of visit with not being able to visualize her ear.  Follow closely.  Call with update.      I discussed the assessment and treatment plan with the patient. The patient was provided an opportunity to ask questions and all were answered. The patient agreed with the plan and demonstrated an understanding of the instructions.   The patient was advised to call back or seek an in-person evaluation if the symptoms worsen or if the condition fails to improve as anticipated.   [name], MD

## 2019-03-18 ENCOUNTER — Encounter: Payer: Self-pay | Admitting: Internal Medicine

## 2019-03-20 ENCOUNTER — Encounter: Payer: Self-pay | Admitting: Internal Medicine

## 2019-03-20 DIAGNOSIS — H60339 Swimmer's ear, unspecified ear: Secondary | ICD-10-CM | POA: Insufficient documentation

## 2019-03-20 NOTE — Assessment & Plan Note (Signed)
Stress is better.  Does not feel needs any further intervention.  Follow.

## 2019-03-20 NOTE — Assessment & Plan Note (Signed)
Blood pressure has been under good control.  Follow pressures.  Follow metabolic panel.  

## 2019-03-20 NOTE — Assessment & Plan Note (Signed)
Low carb diet and exercise.  Overdue labs.  Needs met b and a1c.  Needs regular eye exams.

## 2019-03-20 NOTE — Assessment & Plan Note (Signed)
Question of swimmer's ear.  Treat with cortisporin otic.  Discussed limitations of visit with not being able to visualize her ear.  Follow closely.  Call with update.

## 2019-03-20 NOTE — Assessment & Plan Note (Signed)
On crestor.  Low cholesterol diet and exercise.  Follow lipid panel and liver function tests.   

## 2019-04-20 ENCOUNTER — Other Ambulatory Visit: Payer: Self-pay | Admitting: Internal Medicine

## 2019-05-09 ENCOUNTER — Other Ambulatory Visit: Payer: Self-pay | Admitting: Internal Medicine

## 2019-07-13 ENCOUNTER — Other Ambulatory Visit: Payer: Self-pay | Admitting: Internal Medicine

## 2019-07-28 ENCOUNTER — Encounter: Payer: Self-pay | Admitting: Internal Medicine

## 2019-07-29 NOTE — Telephone Encounter (Signed)
I have not seen her since 03/2019.  Please schedule a doxy appt with me for next week and can discuss.

## 2019-07-29 NOTE — Telephone Encounter (Signed)
Called Pt back and scheduled her a Doxy visit for 08/08/2019

## 2019-08-03 ENCOUNTER — Other Ambulatory Visit: Payer: Self-pay | Admitting: Internal Medicine

## 2019-08-08 ENCOUNTER — Encounter: Payer: Self-pay | Admitting: Internal Medicine

## 2019-08-08 ENCOUNTER — Other Ambulatory Visit: Payer: Self-pay

## 2019-08-08 ENCOUNTER — Ambulatory Visit (INDEPENDENT_AMBULATORY_CARE_PROVIDER_SITE_OTHER): Payer: Self-pay | Admitting: Internal Medicine

## 2019-08-08 DIAGNOSIS — E7800 Pure hypercholesterolemia, unspecified: Secondary | ICD-10-CM

## 2019-08-08 DIAGNOSIS — E119 Type 2 diabetes mellitus without complications: Secondary | ICD-10-CM

## 2019-08-08 DIAGNOSIS — I10 Essential (primary) hypertension: Secondary | ICD-10-CM

## 2019-08-08 DIAGNOSIS — R109 Unspecified abdominal pain: Secondary | ICD-10-CM

## 2019-08-08 NOTE — Progress Notes (Signed)
Patient ID: Caroline Meyers, female   DOB: 07-Jan-1961, 59 y.o.   MRN: [id number]   Virtual Visit via video Note  This visit type was conducted due to national recommendations for restrictions regarding the COVID-19 pandemic (e.g. social distancing).  This format is felt to be most appropriate for this patient at this time.  All issues noted in this document were discussed and addressed.  No physical exam was performed (except for noted visual exam findings with Video Visits).   I connected with Caroline Meyers by a video enabled telemedicine application and verified that I am speaking with the correct person using two identifiers. Location patient: home Location provider: work  Persons participating in the virtual visit: patient, provider  The limitations, risks, security and privacy concerns of performing an evaluation and management service by video and the availability of in person appointments have been discussed. The patient expressed understanding and agreed to proceed.   Reason for visit: scheduled follow up.   HPI: She reports she is doing relatively well.  She is walking some.  [place]. No chest pain or sob reported.  No acid reflux.  No vomiting.  Does report having stomach ache recently.  Started about 3-4 weeks ago.  Occurs at night.  Some diarrhea.  Was questioning if could be related to stress.  Last 3-4 nights have been better.  Normal bowel movement.  Sugars averaging 139-151.  Discussed diet and exercise.     ROS: See pertinent positives and negatives per HPI.  Past Medical History:  Diagnosis Date  . Achilles tendon tear    age 77  . Environmental allergies   . Hypercholesteremia   . Hyperglycemia   . Hypertension   . Pre-diabetes   . Sleep apnea     Past Surgical History:  Procedure Laterality Date  . BREAST BIOPSY Right 2008   cyst removed  . COLONOSCOPY WITH PROPOFOL N/A 03/08/2018   Procedure: COLONOSCOPY WITH PROPOFOL;  Surgeon: Jonathon Bellows, MD;   Location[facility] ENDOSCOPY;  Service: Gastroenterology;  Laterality: N/A;  . lumbar microdiskectomy  07/03/09    Family History  Problem Relation Age of Onset  . Alzheimer's disease Other        grandmother  . Heart disease Other        myocardial infarction (67s)- grandfather  . Hypercholesterolemia Mother   . Hypercholesterolemia Sister   . Breast cancer Neg Hx   . Colon cancer Neg Hx     SOCIAL HX: reviewed.    Current Outpatient Medications:  .  acetaminophen (TYLENOL) 325 MG tablet, Take 650 mg by mouth as needed for headache., Disp: , Rfl:  .  blood glucose meter kit and supplies, Dispense based on patient and insurance preference. Use up to four times daily as directed. (FOR ICD-10 E10.9, E11.9)., Disp: 1 each, Rfl: 0 .  EPINEPHrine (EPIPEN 2-PAK) 0.3 mg/0.3 mL IJ SOAJ injection, Inject 0.3 mLs (0.3 mg total) into the muscle once., Disp: 1 Device, Rfl: 1 .  glucose blood (ONETOUCH VERIO) test strip, CHECK BLOOD SUGAR ONCE DAILY, Disp: 100 each, Rfl: 6 .  Lancets (ONETOUCH ULTRASOFT) lancets, Use as instructed to check blood sugars once daily., Disp: 100 each, Rfl: 12 .  Lancets (ONETOUCH ULTRASOFT) lancets, Use as instructed. Use to check blood sugars once daily as directed. Dispense ONE TOUCH VEROFLEX LANCETS., Disp: 100 each, Rfl: 12 .  metFORMIN (GLUCOPHAGE-XR) 500 MG 24 hr tablet, TAKE 2 TABLETS BY MOUTH TWICE A DAY, Disp: 360 tablet,  Rfl: 0 .  neomycin-polymyxin-hydrocortisone (CORTISPORIN) OTIC solution, Place 4 drops into the right ear 4 (four) times daily., Disp: 10 mL, Rfl: 0 .  rosuvastatin (CRESTOR) 5 MG tablet, TAKE 1 TABLET BY MOUTH EVERY DAY, Disp: 90 tablet, Rfl: 0  EXAM:  GENERAL: alert, oriented, appears well and in no acute distress  HEENT: atraumatic, conjunttiva clear, no obvious abnormalities on inspection of external nose and ears  NECK: normal movements of the head and neck  LUNGS: on inspection no signs of respiratory distress, breathing rate appears  normal, no obvious gross SOB, gasping or wheezing  CV: no obvious cyanosis  PSYCH/NEURO: pleasant and cooperative, no obvious depression or anxiety, speech and thought processing grossly intact  ASSESSMENT AND PLAN:  Discussed the following assessment and plan:  Diabetes mellitus ([facility]) Low carb diet and exercise.  Overdue labs.  [place]  Will notify me where to fax lab orders.  Discussed need for f/u labs including met b and a1c as well and urine microalb/cr radio.    Hypercholesterolemia On crestor.  Low cholesterol diet and exercise.  Follow lipid panel and liver function tests.  Overdue labs.    Hypertension Discussed spot checking pressures.  If elevation, will need medication.  Follow pressures.  Follow metabolic panel.    Abdominal pain Described "stomach ache" as outlined.  Unclear etiology.  Keep bowels moving regularly.  Last few nights have been better and aching not an issue.  Increased stress.  She was questioning if could contribute to symptoms.  Discussed further evaluation and w/up.  Needs labs.  Will notify me where to fax lab orders.  Discussed scanning if persistent.      I discussed the assessment and treatment plan with the patient. The patient was provided an opportunity to ask questions and all were answered. The patient agreed with the plan and demonstrated an understanding of the instructions.   The patient was advised to call back or seek an in-person evaluation if the symptoms worsen or if the condition fails to improve as anticipated.   [name], MD

## 2019-08-14 ENCOUNTER — Encounter: Payer: Self-pay | Admitting: Internal Medicine

## 2019-08-14 DIAGNOSIS — R109 Unspecified abdominal pain: Secondary | ICD-10-CM | POA: Insufficient documentation

## 2019-08-14 NOTE — Assessment & Plan Note (Signed)
Low carb diet and exercise.  Overdue labs.  [place]  Will notify me where to fax lab orders.  Discussed need for f/u labs including met b and a1c as well and urine microalb/cr radio.

## 2019-08-14 NOTE — Assessment & Plan Note (Signed)
Discussed spot checking pressures.  If elevation, will need medication.  Follow pressures.  Follow metabolic panel.

## 2019-08-14 NOTE — Assessment & Plan Note (Signed)
On crestor.  Low cholesterol diet and exercise.  Follow lipid panel and liver function tests.  Overdue labs.

## 2019-08-14 NOTE — Assessment & Plan Note (Addendum)
Described "stomach ache" as outlined.  Unclear etiology.  Keep bowels moving regularly.  Last few nights have been better and aching not an issue.  Increased stress.  She was questioning if could contribute to symptoms.  Discussed further evaluation and w/up.  Needs labs.  Will notify me where to fax lab orders.  Discussed scanning if persistent.  Of note, colonoscopy 02/2018.  Recommended f/u in 3 years.

## 2019-10-03 ENCOUNTER — Ambulatory Visit: Payer: Self-pay | Admitting: Internal Medicine

## 2019-10-05 ENCOUNTER — Encounter: Payer: Self-pay | Admitting: Internal Medicine

## 2019-10-06 ENCOUNTER — Other Ambulatory Visit: Payer: Self-pay | Admitting: Internal Medicine

## 2019-10-06 NOTE — Telephone Encounter (Signed)
Lab orders printed

## 2019-10-07 NOTE — Telephone Encounter (Signed)
It is a [employer].  I hit to reprint.  Should print this time.

## 2019-10-07 NOTE — Telephone Encounter (Signed)
Labs did not print and I do not see any requisitions in the chart for me to print for you to sign

## 2019-10-28 ENCOUNTER — Other Ambulatory Visit: Payer: Self-pay | Admitting: Internal Medicine

## 2019-12-31 ENCOUNTER — Other Ambulatory Visit: Payer: Self-pay | Admitting: Internal Medicine

## 2020-01-05 ENCOUNTER — Telehealth: Payer: Self-pay

## 2020-01-05 DIAGNOSIS — E119 Type 2 diabetes mellitus without complications: Secondary | ICD-10-CM

## 2020-01-05 NOTE — Telephone Encounter (Signed)
LMTCB. Need to schedule pt for a lab appt, she is past due.   According to [facility] pt is past due for an A1c. I have ordered the A1c is there anything else that needs to be ordered?

## 2020-01-05 NOTE — Telephone Encounter (Signed)
Pt is overdue f/u appt and labs.  Please schedule for appt and if she needs me to fax order for labs, let me know where to fax orders.  Thanks

## 2020-01-06 NOTE — Telephone Encounter (Signed)
Pt called back and said that she will not be in town for the next couple months and will call back and schedule

## 2020-01-06 NOTE — Telephone Encounter (Signed)
LMTCB and schedule labs and follow up

## 2020-01-22 ENCOUNTER — Other Ambulatory Visit: Payer: Self-pay | Admitting: Internal Medicine

## 2020-04-19 ENCOUNTER — Other Ambulatory Visit: Payer: Self-pay | Admitting: Internal Medicine

## 2020-07-12 ENCOUNTER — Other Ambulatory Visit: Payer: Self-pay | Admitting: Internal Medicine

## 2020-07-25 ENCOUNTER — Encounter: Payer: Self-pay | Admitting: *Deleted

## 2020-08-07 ENCOUNTER — Encounter: Payer: Self-pay | Admitting: Internal Medicine

## 2020-08-07 NOTE — Telephone Encounter (Signed)
Can you call and get her scheduled. I will refill her meds until appt. I has been a year since she was seen.

## 2020-08-07 NOTE — Telephone Encounter (Signed)
Called and scheduled pt a VV

## 2020-08-16 LAB — BASIC METABOLIC PANEL
BUN: 16 (ref 4–21)
CO2: 26 — AB (ref 13–22)
Chloride: 101 (ref 99–108)
Creatinine: 0.6 (ref 0.5–1.1)
Glucose: 226
Potassium: 4.3 (ref 3.4–5.3)
Sodium: 136 — AB (ref 137–147)

## 2020-08-16 LAB — LIPID PANEL
Cholesterol: 176 (ref 0–200)
HDL: 37 (ref 35–70)
LDL Cholesterol: 104
Triglycerides: 174 — AB (ref 40–160)

## 2020-08-16 LAB — TSH: TSH: 5.63 (ref 0.41–5.90)

## 2020-08-16 LAB — HEMOGLOBIN A1C: Hemoglobin A1C: 8.5

## 2020-08-16 LAB — HEPATIC FUNCTION PANEL
ALT: 25 (ref 7–35)
AST: 21 (ref 13–35)
Alkaline Phosphatase: 51 (ref 25–125)
Bilirubin, Direct: 0.1 (ref 0.01–0.4)

## 2020-08-16 LAB — COMPREHENSIVE METABOLIC PANEL
Albumin: 4.3 (ref 3.5–5.0)
Calcium: 9.4 (ref 8.7–10.7)
GFR calc Af Amer: 132
GFR calc non Af Amer: 109

## 2020-08-16 LAB — CBC AND DIFFERENTIAL
HCT: 43 (ref 36–46)
Hemoglobin: 14.5 (ref 12.0–16.0)
Platelets: 347 (ref 150–399)
WBC: 6.4

## 2020-08-16 LAB — CBC: RBC: 4.68 (ref 3.87–5.11)

## 2020-08-17 ENCOUNTER — Telehealth: Payer: Self-pay

## 2020-08-17 NOTE — Telephone Encounter (Signed)
Reviewed labs.  A1c 8.5.  she is on metformin.  Will need to add additional medication.  Needs appt to discuss.  Labs placed in box.  Needs to be abstracted and hold out for app.  No cholesterol labs included.  Did we get all of the pages?

## 2020-08-17 NOTE — Telephone Encounter (Signed)
Labs received from [facility] in your folder for review

## 2020-08-21 NOTE — Telephone Encounter (Signed)
Called patient to try to schedule appt. Unable to leave message. Cholesterol results were received.

## 2020-08-22 NOTE — Telephone Encounter (Signed)
Called and moved pts appt up. She has only been taking one metformin a day. Will discuss further at appt

## 2020-08-29 ENCOUNTER — Telehealth (INDEPENDENT_AMBULATORY_CARE_PROVIDER_SITE_OTHER): Payer: BC Managed Care – PPO | Admitting: Internal Medicine

## 2020-08-29 DIAGNOSIS — F32A Depression, unspecified: Secondary | ICD-10-CM

## 2020-08-29 DIAGNOSIS — F419 Anxiety disorder, unspecified: Secondary | ICD-10-CM

## 2020-08-29 DIAGNOSIS — E1165 Type 2 diabetes mellitus with hyperglycemia: Secondary | ICD-10-CM | POA: Diagnosis not present

## 2020-08-29 DIAGNOSIS — E7800 Pure hypercholesterolemia, unspecified: Secondary | ICD-10-CM | POA: Diagnosis not present

## 2020-08-29 DIAGNOSIS — I10 Essential (primary) hypertension: Secondary | ICD-10-CM

## 2020-08-29 MED ORDER — METFORMIN HCL ER 500 MG PO TB24: 500.0000 mg | ORAL_TABLET | Freq: Two times a day (BID) | ORAL | 1 refills | Status: DC

## 2020-08-29 MED ORDER — ROSUVASTATIN CALCIUM 10 MG PO TABS: 10.0000 mg | ORAL_TABLET | Freq: Every day | ORAL | 1 refills | Status: DC

## 2020-08-29 NOTE — Progress Notes (Signed)
Patient ID: Caroline Meyers, female   DOB: 1960/11/29, 60 y.o.   MRN: [id number]   Virtual Visit via video Note  This visit type was conducted due to national recommendations for restrictions regarding the COVID-19 pandemic (e.g. social distancing).  This format is felt to be most appropriate for this patient at this time.  All issues noted in this document were discussed and addressed.  No physical exam was performed (except for noted visual exam findings with Video Visits).   I connected with Ebbie Latus by a video enabled telemedicine application and verified that I am speaking with the correct person using two identifiers. Location patient: home Location provider: work Persons participating in the virtual visit: patient, provider  The limitations, risks, security and privacy concerns of performing an evaluation and management service by video and the availability of in person appointments have been discussed.  It has also been discussed with the patient that there may be a patient responsible charge related to this service. The patient expressed understanding and agreed to proceed.  Reason for visit: follow up appt  HPI: Follow up regarding her blood sugar, cholesterol and blood pressure.  Has not been taking her metformin as directed.  Was going to run out, so she started taking only one metformin per day.  (was previously instructed to take 2 bid).  Not checking sugars.  Ran out of lancets. Discussed labs.  Elevated a1c.  Discussed starting back on metformin.  Reported that on one metformin per day, her bowels had been better.  No as loose.  No chest pain or sob reported.  No abdominal pain.  Bowels better now.  UTI 06/2020. Took AZO.  Helped.  vagisil helped itching.  Discussed the need for in person f/u/physical.  Planning to see dentist next week and eye MD 09/2020.  Discussed other treatment options.     ROS: See pertinent positives and negatives per HPI.  Past Medical History:  Diagnosis  Date  . Achilles tendon tear    age 89  . Environmental allergies   . Hypercholesteremia   . Hyperglycemia   . Hypertension   . Pre-diabetes   . Sleep apnea     Past Surgical History:  Procedure Laterality Date  . BREAST BIOPSY Right 2008   cyst removed  . COLONOSCOPY WITH PROPOFOL N/A 03/08/2018   Procedure: COLONOSCOPY WITH PROPOFOL;  Surgeon: Jonathon Bellows, MD;  Location: [facility] ENDOSCOPY;  Service: Gastroenterology;  Laterality: N/A;  . lumbar microdiskectomy  07/03/09    Family History  Problem Relation Age of Onset  . Alzheimer's disease Other        grandmother  . Heart disease Other        myocardial infarction (85s)- grandfather  . Hypercholesterolemia Mother   . Hypercholesterolemia Sister   . Breast cancer Neg Hx   . Colon cancer Neg Hx     SOCIAL HX: reviewed   Current Outpatient Medications:  .  rosuvastatin (CRESTOR) 10 MG tablet, Take 1 tablet (10 mg total) by mouth daily., Disp: 90 tablet, Rfl: 1 .  acetaminophen (TYLENOL) 325 MG tablet, Take 650 mg by mouth as needed for headache., Disp: , Rfl:  .  blood glucose meter kit and supplies, Dispense based on patient and insurance preference. Use up to four times daily as directed. (FOR ICD-10 E10.9, E11.9)., Disp: 1 each, Rfl: 0 .  EPINEPHrine (EPIPEN 2-PAK) 0.3 mg/0.3 mL IJ SOAJ injection, Inject 0.3 mLs (0.3 mg total) into the muscle once., Disp: 1 Device,  Rfl: 1 .  glucose blood (ONETOUCH VERIO) test strip, CHECK BLOOD SUGAR ONCE DAILY, Disp: 100 each, Rfl: 6 .  Lancets (ONETOUCH ULTRASOFT) lancets, Use as instructed to check blood sugars once daily., Disp: 100 each, Rfl: 12 .  Lancets (ONETOUCH ULTRASOFT) lancets, Use as instructed. Use to check blood sugars once daily as directed. Dispense ONE TOUCH VEROFLEX LANCETS., Disp: 100 each, Rfl: 12 .  metFORMIN (GLUCOPHAGE-XR) 500 MG 24 hr tablet, Take 1 tablet (500 mg total) by mouth 2 (two) times daily., Disp: 180 tablet, Rfl: 1 .  neomycin-polymyxin-hydrocortisone  (CORTISPORIN) OTIC solution, Place 4 drops into the right ear 4 (four) times daily., Disp: 10 mL, Rfl: 0  EXAM:  GENERAL: alert, oriented, appears well and in no acute distress  HEENT: atraumatic, conjunttiva clear, no obvious abnormalities on inspection of external nose and ears  NECK: normal movements of the head and neck  LUNGS: on inspection no signs of respiratory distress, breathing rate appears normal, no obvious gross SOB, gasping or wheezing  CV: no obvious cyanosis  PSYCH/NEURO: pleasant and cooperative, no obvious depression or anxiety, speech and thought processing grossly intact  ASSESSMENT AND PLAN:  Discussed the following assessment and plan:  Problem List Items Addressed This Visit    Anxiety and depression    Overall appears to be handling things relatively well.  Follow.        Diabetes mellitus ([place])    Low carb diet and exercise.  On metformin, but only taking one per day.  Bowels better on the lower dose.  Will continue metformin 556m bid.  Have her spot check her sugar and send in readings.  Discussed starting ozempic, trulicity or jardiance, etc.  CCM referral and will coordinate demonstration (if needed) at her physical appt.  She will call to schedule.  Try to coordinate when in town.        Relevant Medications   rosuvastatin (CRESTOR) 10 MG tablet   metFORMIN (GLUCOPHAGE-XR) 500 MG 24 hr tablet   Other Relevant Orders   AMB Referral to [facility]ordin[facility]   Hypercholesterolemia    On crestor.  Increase to 122mq day.  Follow lipid panel and liver function tests.        Relevant Medications   rosuvastatin (CRESTOR) 10 MG tablet   Hypertension    Spot check pressures.  On no medication.  If elevated, start ARB or ACE inhibitor.        Relevant Medications   rosuvastatin (CRESTOR) 10 MG tablet       I discussed the assessment and treatment plan with the patient. The patient was provided an opportunity to ask questions and all were  answered. The patient agreed with the plan and demonstrated an understanding of the instructions.   The patient was advised to call back or seek an in-person evaluation if the symptoms worsen or if the condition fails to improve as anticipated.   ChEinar PheasantMD

## 2020-09-02 ENCOUNTER — Encounter: Payer: Self-pay | Admitting: Internal Medicine

## 2020-09-02 NOTE — Assessment & Plan Note (Addendum)
Low carb diet and exercise.  On metformin, but only taking one per day.  Bowels better on the lower dose.  Will continue metformin 500mg  bid.  Have her spot check her sugar and send in readings.  Discussed starting ozempic, trulicity or jardiance, etc.  CCM referral and will coordinate demonstration (if needed) at her physical appt.  She will call to schedule.  Try to coordinate when in town.

## 2020-09-02 NOTE — Assessment & Plan Note (Signed)
Spot check pressures.  On no medication.  If elevated, start ARB or ACE inhibitor.   °

## 2020-09-02 NOTE — Assessment & Plan Note (Signed)
On crestor.  Increase to 10mg  q day.  Follow lipid panel and liver function tests.

## 2020-09-02 NOTE — Assessment & Plan Note (Signed)
Overall appears to be handling things relatively well.  Follow.   

## 2020-09-03 ENCOUNTER — Encounter: Payer: Self-pay | Admitting: Internal Medicine

## 2020-09-04 ENCOUNTER — Encounter: Payer: Self-pay | Admitting: Internal Medicine

## 2020-09-04 ENCOUNTER — Telehealth: Payer: Self-pay

## 2020-09-04 NOTE — Chronic Care Management (AMB) (Signed)
  Care Management   Outreach Note  09/04/2020 Name: Caroline Meyers MRN: [id number] DOB: 1961-05-09  Referred by: [name], MD Reason for referral : Care Coordination (Outreach to schedule referral for Pharm D )   An unsuccessful telephone outreach was attempted today. The patient was referred to the case management team for assistance with care management and care coordination.   Follow Up Plan: A HIPAA compliant phone message was left for the patient providing contact information and requesting a return call.  The care management team will reach out to the patient again over the next 5 days.  If patient returns call to provider office, please advise to call [facility]  at [place], [place], [place], [place] [address] Direct Dial: [phone] [email or web address]

## 2020-09-05 ENCOUNTER — Other Ambulatory Visit: Payer: Self-pay

## 2020-09-05 MED ORDER — ONETOUCH ULTRASOFT LANCETS MISC: 12 refills | Status: AC

## 2020-09-07 ENCOUNTER — Telehealth: Payer: Self-pay | Admitting: Internal Medicine

## 2020-09-20 ENCOUNTER — Ambulatory Visit: Payer: BC Managed Care – PPO | Admitting: Pharmacist

## 2020-09-20 DIAGNOSIS — E1165 Type 2 diabetes mellitus with hyperglycemia: Secondary | ICD-10-CM

## 2020-09-20 DIAGNOSIS — E7800 Pure hypercholesterolemia, unspecified: Secondary | ICD-10-CM

## 2020-09-20 MED ORDER — OZEMPIC (0.25 OR 0.5 MG/DOSE) 2 MG/1.5ML ~~LOC~~ SOPN: PEN_INJECTOR | SUBCUTANEOUS | 2 refills | Status: DC

## 2020-09-20 NOTE — Patient Instructions (Signed)
Visit Information  PATIENT GOALS:  Goals Addressed              This Visit's Progress     Patient Stated   .  Medication Monitoring (pt-stated)        Patient Goals/Self-Care Activities . Over the next 90 days, patient will:  - take medications as prescribed check glucose twice daily, document, and provide at future appointments       Caroline Meyers was given information about Care Management services today including:  1. Care Management services include personalized support from designated clinical staff supervised by her physician, including individualized plan of care and coordination with other care providers 2. 24/7 contact phone numbers for assistance for urgent and routine care needs. 3. The patient may stop CCM services at any time (effective at the end of the month) by phone call to the office staff.  Patient agreed to services and verbal consent obtained.   Patient verbalizes understanding of instructions provided today and agrees to view in [place].   Plan: Telephone follow up appointment with care management team member scheduled for:  ~ 6 weeks  [name], PharmD, [place], [place] Clinical Pharmacist [employer] at [facility] [phone]

## 2020-09-20 NOTE — Chronic Care Management (AMB) (Signed)
Care Management   Pharmacy Note  09/20/2020 Name: Caroline Meyers MRN: [id number] DOB: Apr 19, 1961  Subjective: Caroline Meyers is a 60 y.o. year old female who is a primary care patient of [name], MD. The Care Management team was consulted for assistance with care management and care coordination needs.    Engaged with patient by telephone for initial visit in response to provider referral for pharmacy case management and/or care coordination services.   The patient was given information about Care Management services today including:  1. Care Management services includes personalized support from designated clinical staff supervised by the patient's primary care provider, including individualized plan of care and coordination with other care providers. 2. 24/7 contact phone numbers for assistance for urgent and routine care needs. 3. The patient may stop case management services at any time by phone call to the office staff.  Patient agreed to services and consent obtained.  Assessment:  Review of patient status, including review of consultants reports, laboratory and other test data, was performed as part of comprehensive evaluation and provision of chronic care management services.   SDOH (Social Determinants of Health) assessments and interventions performed:  SDOH Interventions   Flowsheet Row Most Recent Value  SDOH Interventions   Financial Strain Interventions Intervention Not Indicated       Objective:  Lab Results  Component Value Date   CREATININE 0.6 08/16/2020   CREATININE 0.59 02/24/2018   CREATININE 0.56 05/19/2017    Lab Results  Component Value Date   HGBA1C 8.5 08/16/2020       Component Value Date/Time   CHOL 176 08/16/2020 0000   TRIG 174 (A) 08/16/2020 0000   HDL 37 08/16/2020 0000   CHOLHDL 4 02/24/2018 0830   VLDL 28.0 02/24/2018 0830   LDLCALC 104 08/16/2020 0000   LDLDIRECT 176.7 10/07/2012 0826   Clinical ASCVD: No    BP Readings from  Last 3 Encounters:  06/10/18 (!) 165/96  05/18/18 132/88  03/08/18 137/79    Care Plan  Allergies  Allergen Reactions  . Shellfish Allergy Shortness Of Breath and Swelling    "throat closing"  . Aspirin Hives  . Penicillins   . Tartrazine Hives    Medications Reviewed Today    Reviewed by [name], [name] (Pharmacist) on 09/20/20 at 1503  Med List Status: <None>  Medication Order Taking? Sig Documenting Provider Last Dose Status Informant  acetaminophen (TYLENOL) 325 MG tablet [id number] No Take 650 mg by mouth as needed for headache.  Patient not taking: Reported on 09/20/2020   [provider] Not Taking Active   blood glucose meter kit and supplies [id number]  Dispense based on patient and insurance preference. Use up to four times daily as directed. (FOR ICD-10 E10.9, E11.9). [name], MD  Active   EPINEPHrine (EPIPEN 2-PAK) 0.3 mg/0.3 mL IJ SOAJ injection [id number] No Inject 0.3 mLs (0.3 mg total) into the muscle once.  Patient not taking: Reported on 09/20/2020   [name], MD Not Taking Active   glucose blood [facility] VERIO) test strip [id number] Yes CHECK BLOOD SUGAR ONCE DAILY [name], MD Taking Active   Lancets ([place]) lancets [id number] Yes Use as instructed to check blood sugars once daily. [name], MD Taking Active   metFORMIN (GLUCOPHAGE-XR) 500 MG 24 hr tablet [id number] Yes Take 1 tablet (500 mg total) by mouth 2 (two) times daily. [name], MD Taking Active   Probiotic Product (PROBIOTIC DAILY PO) [id number] Yes Take by mouth.  [provider] Taking Active   rosuvastatin (CRESTOR) 10 MG tablet [id number] Yes Take 1 tablet (10 mg total) by mouth daily. [name], MD Taking Active           Patient Active Problem List   Diagnosis Date Noted  . Abdominal pain 08/14/2019  . 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 Health care maintenance 04/25/2016  . Anxiety and depression 09/01/2015  . Low back  pain with sciatica 11/26/2012  . Sleep apnea 05/26/2012  . Diabetes mellitus ([place]) 05/26/2012  . Hypercholesterolemia 05/26/2012  . Hypertension 05/26/2012    Conditions to be addressed/monitored: HLD and DMII  Care Plan : Medication Monitoring  Updates made by [name], [name] since 09/20/2020 12:00 AM    Problem: Diabetes     Long-Range Goal: Disease Progression Prevention   Start Date: 09/20/2020  This Visit's Progress: On track  Priority: High  Note:   Current Barriers:  . Unable to achieve control of diabetes   Pharmacist Clinical Goal(s):  Marland Kitchen Over the next 90 days, patient will achieve control of diabetes as evidenced by A1c  through collaboration with PharmD and provider.   Interventions: . 1:1 collaboration with [name], MD regarding development and update of comprehensive plan of care as evidenced by provider attestation and co-signature . Inter-disciplinary care team collaboration (see longitudinal plan of care) . Comprehensive medication review performed; medication list updated in electronic medical record  Health Maintenance: . Due for COVID booster. Notes that she has been so busy with work that she has not felt like having to take time as a result of side effects . Due for foot exam. Eye exam scheduled next week. Reviewed to have results faxed to our office.  . Due for all other screening - Hep C, Mammogram, Tdap, Pneumona. Lives in [place], [place].   Diabetes: . Uncontrolled; current treatment: metformin XR 500 mg BID - previous prescribed 1000 mg BID, but self reduced to 500 mg daily to conserve supply when she was in between provider visits. Noted decreased frequency of soft/loose stools since reducing metformin dose.   . Current glucose readings: fasting glucose: 190-220, post prandial glucose: not checking often . Denies improvement hyperglycemic symptoms since increasing metformin dose - notes improvement in fatigue, polyuria, polydipsia over  the past several weeks . Given GI side effects with higher metformin doses, continue current dose at this time  . Discussed goal A1c, goal fasting, and goal 2 hour post prandial glucose.  . Counseled on GLP1 vs SGLT2. Discussed mechanism, side effects, long term benefits. Patient reports she had a UTI a few months ago, would prefer to start with GLP1. Discussed Ozempic. Patient amenable. Start Ozempic 0.25 mg weekly for 4 weeks then increase to 0.5 mg weekly.  . Discussed availability of savings card on [employer].   Hyperlipidemia: . Uncontrolled, though anticipated to be improved; current treatment: rosuvastatin 10 mg daily (previously on 5 mg daily) . Continue current regimen at this time   Patient Goals/Self-Care Activities . Over the next 90 days, patient will:  - take medications as prescribed check glucose twice daily, document, and provide at future appointments  Follow Up Plan: Telephone follow up appointment with care management team member scheduled for: ~ 5 weeks      Medication Assistance:  None required.  Patient affirms current coverage meets needs.  Follow Up:  Patient agrees to Care Plan and Follow-up.  Plan: Telephone follow up appointment with care management team member scheduled for:  ~ 6  weeks  [name], PharmD, [place], [place] Clinical Pharmacist [employer] at [place]

## 2020-09-24 LAB — HM DIABETES EYE EXAM

## 2020-09-26 NOTE — Chronic Care Management (AMB) (Signed)
  Care Management   Note  09/26/2020 Name: Caroline Meyers MRN: [id number] DOB: 09/24/1960  Caroline Meyers is a 60 y.o. year old female who is a primary care patient of [name], MD. I reached out to Caroline Meyers by phone today in response to a referral sent by Caroline Meyers's health plan.    Caroline Meyers was given information about care management services today including:  1. Care management services include personalized support from designated clinical staff supervised by her physician, including individualized plan of care and coordination with other care providers 2. 24/7 contact phone numbers for assistance for urgent and routine care needs. 3. The patient may stop care management services at any time by phone call to the office staff.  Patient agreed to services and verbal consent obtained.   Follow up plan: Telephone appointment with care management team member scheduled for:10/17/2020  Caroline Meyers, [place], [place], [place] [address] Direct Dial: [phone] [email or web address] Website: [email or web address]

## 2020-10-17 ENCOUNTER — Ambulatory Visit: Payer: BC Managed Care – PPO | Admitting: Pharmacist

## 2020-10-17 DIAGNOSIS — E7800 Pure hypercholesterolemia, unspecified: Secondary | ICD-10-CM

## 2020-10-17 DIAGNOSIS — E1165 Type 2 diabetes mellitus with hyperglycemia: Secondary | ICD-10-CM

## 2020-10-17 MED ORDER — OZEMPIC (0.25 OR 0.5 MG/DOSE) 2 MG/1.5ML ~~LOC~~ SOPN: 0.5000 mg | PEN_INJECTOR | SUBCUTANEOUS | 2 refills | Status: DC

## 2020-10-17 NOTE — Addendum Note (Signed)
Addended by: [name] on: 10/17/2020 01:20 PM   Modules accepted: Orders

## 2020-10-17 NOTE — Patient Instructions (Signed)
Visit Information  Goals Addressed              This Visit's Progress     Patient Stated   .  Medication Monitoring (pt-stated)        Patient Goals/Self-Care Activities . Over the next 90 days, patient will:  - take medications as prescribed check glucose twice daily, document, and provide at future appointments        Patient verbalizes understanding of instructions provided today and agrees to view in [place].    Plan: Telephone follow up appointment with care management team member scheduled for:  ~ 6 weks  [name], PharmD, [place], [place] Clinical Pharmacist [employer] at [employer] [facility] [phone]

## 2020-10-17 NOTE — Chronic Care Management (AMB) (Signed)
Care Management   Pharmacy Note  10/17/2020 Name: HARMAN FERRIN MRN: [id number] DOB: 08-01-1960  Subjective: Caroline Meyers is a 60 y.o. year old female who is a primary care patient of [name], MD. The Care Management team was consulted for assistance with care management and care coordination needs.    Engaged with patient by telephone for follow up visit in response to provider referral for pharmacy case management and/or care coordination services.   The patient was given information about Care Management services today including:  1. Care Management services includes personalized support from designated clinical staff supervised by the patient's primary care provider, including individualized plan of care and coordination with other care providers. 2. 24/7 contact phone numbers for assistance for urgent and routine care needs. 3. The patient may stop case management services at any time by phone call to the office staff.  Patient agreed to services and consent obtained.  Assessment:  Review of patient status, including review of consultants reports, laboratory and other test data, was performed as part of comprehensive evaluation and provision of chronic care management services.   SDOH (Social Determinants of Health) assessments and interventions performed:    Objective:  Lab Results  Component Value Date   CREATININE 0.6 08/16/2020   CREATININE 0.59 02/24/2018   CREATININE 0.56 05/19/2017    Lab Results  Component Value Date   HGBA1C 8.5 08/16/2020       Component Value Date/Time   CHOL 176 08/16/2020 0000   TRIG 174 (A) 08/16/2020 0000   HDL 37 08/16/2020 0000   CHOLHDL 4 02/24/2018 0830   VLDL 28.0 02/24/2018 0830   LDLCALC 104 08/16/2020 0000   LDLDIRECT 176.7 10/07/2012 0826    Clinical ASCVD: No     BP Readings from Last 3 Encounters:  06/10/18 (!) 165/96  05/18/18 132/88  03/08/18 137/79    Care Plan  Allergies  Allergen Reactions  .  Shellfish Allergy Shortness Of Breath and Swelling    "throat closing"  . Aspirin Hives  . Penicillins   . Tartrazine Hives    Medications Reviewed Today    Reviewed by [name], [name] (Pharmacist) on 10/17/20 at 61  Med List Status: <None>  Medication Order Taking? Sig Documenting Provider Last Dose Status Informant  acetaminophen (TYLENOL) 325 MG tablet [id number] No Take 650 mg by mouth as needed for headache.  Patient not taking: No sig reported   [provider] Not Taking Active   EPINEPHrine (EPIPEN 2-PAK) 0.3 mg/0.3 mL IJ SOAJ injection [id number] No Inject 0.3 mLs (0.3 mg total) into the muscle once.  Patient not taking: No sig reported   [name], MD Not Taking Active   glucose blood (ONETOUCH VERIO) test strip [id number] Yes CHECK BLOOD SUGAR ONCE DAILY [name], MD Taking Active   Lancets ([place]) lancets [id number] Yes Use as instructed to check blood sugars once daily. [name], MD Taking Active   metFORMIN (GLUCOPHAGE-XR) 500 MG 24 hr tablet [id number] Yes Take 1 tablet (500 mg total) by mouth 2 (two) times daily. [name], MD Taking Active   Probiotic Product (PROBIOTIC DAILY PO) [id number] Yes Take by mouth. [provider] Taking Active   rosuvastatin (CRESTOR) 10 MG tablet [id number] Yes Take 1 tablet (10 mg total) by mouth daily. [name], MD Taking Active   Semaglutide,0.25 or 0.5MG /DOS, (OZEMPIC, 0.25 OR 0.5 MG/DOSE,) 2 MG/1.5ML SOPN [id number] Yes Inject 0.25 mg weekly for 4 weeks then increase to 0.5 mg  weekly [name], MD Taking Active           Patient Active Problem List   Diagnosis Date Noted  . Abdominal pain 08/14/2019  . 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 Health care maintenance 04/25/2016  . Anxiety and depression 09/01/2015  . Low back pain with sciatica 11/26/2012  . Sleep apnea 05/26/2012  . Diabetes mellitus ([place]) 05/26/2012  . Hypercholesterolemia 05/26/2012  .  Hypertension 05/26/2012    Conditions to be addressed/monitored: HLD and DMII  Care Plan : Medication Monitoring  Updates made by [name], [name] since 10/17/2020 12:00 AM    Problem: Diabetes     Long-Range Goal: Disease Progression Prevention   Start Date: 09/20/2020  This Visit's Progress: On track  Recent Progress: On track  Priority: High  Note:   Current Barriers:  . Unable to achieve control of diabetes   Pharmacist Clinical Goal(s):  Marland Kitchen Over the next 90 days, patient will achieve control of diabetes as evidenced by A1c  through collaboration with PharmD and provider.   Interventions: . 1:1 collaboration with [name], MD regarding development and update of comprehensive plan of care as evidenced by provider attestation and co-signature . Inter-disciplinary care team collaboration (see longitudinal plan of care) . Comprehensive medication review performed; medication list updated in electronic medical record  Health Maintenance: . Due for COVID booster. Notes that she has been so busy with work that she has not felt like having to take time as a result of side effects . Due for foot exam. Received eye exam a few weeks back  . Due for all other screening - Hep C, Mammogram, Tdap, Pneumona. Lives in [place], [place].   Diabetes: . Uncontrolled; current treatment: metformin XR 500 mg BID (max tolerated dose d/t GI upset), Ozempic 0.25 mg weekly x 4 weeks  . Notes big decrease in appetite, decrease in cravings for sweets; does note some constipation (though was going more frequently due to metformin), going once daily now. Mix of firm and loose stools with smaller volume than before. Notes a little bit of stomach ache when she first wakes up, but spontaneously improves.  . Has not weighed herself, but feels better. Notes reduction in polydipsia.  . Current meal patterns - focused on drinking water, occasional diet soda . Current glucose readings: fasting glucose:  117-150s, post prandial glucose: not checking often . Praised for improvement in glucose readings. Significant improvement in glucose readings.  . Stay on Ozempic 0.25 mg weekly to complete this pen (3-4 more weeks). Then, increase to 0.5 mg weekly with next fill.   Hyperlipidemia: . Uncontrolled, though anticipated to be improved; current treatment: rosuvastatin 10 mg daily (previously on 5 mg daily) . Continue current regimen at this time. Repeat lab work, due  Patient Goals/Self-Care Activities . Over the next 90 days, patient will:  - take medications as prescribed check glucose twice daily, document, and provide at future appointments  Follow Up Plan: Telephone follow up appointment with care management team member scheduled for: ~ 6 weeks      Medication Assistance:  None required.  Patient affirms current coverage meets needs.  Follow Up:  Patient agrees to Care Plan and Follow-up.  Plan: Telephone follow up appointment with care management team member scheduled for:  ~ 6 weks  [name], PharmD, [place], [place] Clinical Pharmacist [employer] at [employer] [phone]

## 2020-10-22 ENCOUNTER — Other Ambulatory Visit: Payer: Self-pay

## 2020-10-22 ENCOUNTER — Encounter: Payer: Self-pay | Admitting: Internal Medicine

## 2020-10-22 MED ORDER — EPINEPHRINE 0.3 MG/0.3ML IJ SOAJ: 0.3000 mg | Freq: Once | INTRAMUSCULAR | 1 refills | Status: AC

## 2020-11-29 ENCOUNTER — Encounter: Payer: Self-pay | Admitting: Internal Medicine

## 2020-11-30 ENCOUNTER — Ambulatory Visit: Payer: BC Managed Care – PPO | Admitting: Pharmacist

## 2020-11-30 ENCOUNTER — Telehealth: Payer: Self-pay | Admitting: Internal Medicine

## 2020-11-30 DIAGNOSIS — E7800 Pure hypercholesterolemia, unspecified: Secondary | ICD-10-CM

## 2020-11-30 DIAGNOSIS — E1165 Type 2 diabetes mellitus with hyperglycemia: Secondary | ICD-10-CM

## 2020-11-30 NOTE — Telephone Encounter (Signed)
Patient says that her sleep has been very eratic and she was wanting to know if that could be ozempic side effect. Light headedness is only when she has to wake up in the middle of the night every now and then and does not last longer than a minute. She confirmed no other symptoms. If arm pain persists or light headedness becomes worse she will be evaluated at urgent care close to where she lives

## 2020-11-30 NOTE — Chronic Care Management (AMB) (Signed)
Care Management   Pharmacy Note  11/30/2020 Name: GRACYN ALLOR MRN: [id number] DOB: [birth year]  Subjective: Domenic Schwab is a 60 y.o. year old female who is a primary care patient of [name], MD. The Care Management team was consulted for assistance with care management and care coordination needs.    Engaged with patient by telephone for follow up visit in response to provider referral for pharmacy case management and/or care coordination services.   The patient was given information about Care Management services today including:  1. Care Management services includes personalized support from designated clinical staff supervised by the patient's primary care provider, including individualized plan of care and coordination with other care providers. 2. 24/7 contact phone numbers for assistance for urgent and routine care needs. 3. The patient may stop case management services at any time by phone call to the office staff.  Patient agreed to services and consent obtained.  Assessment:  Review of patient status, including review of consultants reports, laboratory and other test data, was performed as part of comprehensive evaluation and provision of chronic care management services.   SDOH (Social Determinants of Health) assessments and interventions performed:  SDOH Interventions   Flowsheet Row Most Recent Value  SDOH Interventions   Financial Strain Interventions Intervention Not Indicated       Objective:  Lab Results  Component Value Date   CREATININE 0.6 08/16/2020   CREATININE 0.59 02/24/2018   CREATININE 0.56 05/19/2017    Lab Results  Component Value Date   HGBA1C 8.5 08/16/2020       Component Value Date/Time   CHOL 176 08/16/2020 0000   TRIG 174 (A) 08/16/2020 0000   HDL 37 08/16/2020 0000   CHOLHDL 4 02/24/2018 0830   VLDL 28.0 02/24/2018 0830   LDLCALC 104 08/16/2020 0000   LDLDIRECT 176.7 10/07/2012 0826   Clinical ASCVD: No   BP Readings from  Last 3 Encounters:  06/10/18 (!) 165/96  05/18/18 132/88  03/08/18 137/79    Care Plan  Allergies  Allergen Reactions  . Shellfish Allergy Shortness Of Breath and Swelling    "throat closing"  . Aspirin Hives  . Penicillins   . Tartrazine Hives    Medications Reviewed Today    Reviewed by [name], [name] (Pharmacist) on 11/30/20 at 1305  Med List Status: <None>  Medication Order Taking? Sig Documenting Provider Last Dose Status Informant  acetaminophen (TYLENOL) 325 MG tablet [id number] Yes Take 650 mg by mouth as needed for headache. [provider] Taking Active   glucose blood (ONETOUCH VERIO) test strip [id number] Yes CHECK BLOOD SUGAR ONCE DAILY [name], MD Taking Active   Lancets ([place]) lancets [id number] Yes Use as instructed to check blood sugars once daily. [name], MD Taking Active   metFORMIN (GLUCOPHAGE-XR) 500 MG 24 hr tablet [id number] Yes Take 1 tablet (500 mg total) by mouth 2 (two) times daily. [name], MD Taking Active   Probiotic Product (PROBIOTIC DAILY PO) [id number] Yes Take by mouth. [provider] Taking Active   rosuvastatin (CRESTOR) 10 MG tablet [id number] Yes Take 1 tablet (10 mg total) by mouth daily. [name], MD Taking Active   Semaglutide,0.25 or 0.5MG /DOS, (OZEMPIC, 0.25 OR 0.5 MG/DOSE,) 2 MG/1.5ML SOPN [id number] Yes Inject 0.5 mg into the skin once a week. [name], MD Taking Active           Patient Active Problem List   Diagnosis Date Noted  . Abdominal pain 08/14/2019  .  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 Health care maintenance 04/25/2016  . Anxiety and depression 09/01/2015  . Low back pain with sciatica 11/26/2012  . Sleep apnea 05/26/2012  . Diabetes mellitus ([place]) 05/26/2012  . Hypercholesterolemia 05/26/2012  . Hypertension 05/26/2012    Conditions to be addressed/monitored: HLD and DMII  Care Plan : Medication Monitoring  Updates made by [name], [name] since 11/30/2020 12:00 AM    Problem: Diabetes     Long-Range Goal: Disease Progression Prevention   Start Date: 09/20/2020  This Visit's Progress: On track  Recent Progress: On track  Priority: High  Note:   Current Barriers:  . Unable to achieve control of diabetes   Pharmacist Clinical Goal(s):  Marland Kitchen Over the next 90 days, patient will achieve control of diabetes as evidenced by A1c  through collaboration with PharmD and provider.   Interventions: . 1:1 collaboration with [name], MD regarding development and update of comprehensive plan of care as evidenced by provider attestation and co-signature . Inter-disciplinary care team collaboration (see longitudinal plan of care) . Comprehensive medication review performed; medication list updated in electronic medical record  Health Maintenance: . Due for COVID booster.  . Due for foot exam. Received eye exam a few weeks back  . Due for all other screening - Hep C, Mammogram, Tdap, Pneumona. Lives in [place], [place]. Does not have plans to come back to the [place] area until ~ September (after completion of turtle hatching season). Son may have another back surgery, that is TBD. Encouraged to let us know if she plans to be in the area to schedule in office f/u with PCP.  Marland Kitchen Notes occasional episodes of mild dizziness if she gets up too quickly. Sometimes while lying down. Notes she is not sleeping well lately due to work. Advised to check glucose when she is feeling strange, but also advised to purchase home BP cuff to check BP at these times. Advised to reach out to our office or seek local care if consistent elevations SBP >140 or SBP <100 or chest pain, headache, syncopal episodes.  Arm Pain . Reports that she believes she strained a muscle digging in a sea turtle nest last week. Taking ibuprofen PRN with occasional icing. Advised to continue rest, ice, occasional NSAID therapy for inflammation. If no improvement, discussed  seeking a walk in ortho clinic for evaluation. She verbalized understanding.   Diabetes: . Uncontrolled but improved; current treatment: metformin XR 500 mg BID (max tolerated dose d/t GI upset), Ozempic 0.5 mg weekly . Denies any GI upset, GI pain, constipation concerns.  . Current meal patterns - focused on drinking water, occasional diet soda. Very busy during turtle season . Current glucose readings: fasting glucose: 120s-170s (higher if dessert the night before); post prandial glucose: all <180 (see MyChart message for log) . Praised for continued tolerability in therapy. Recommend to continue current regimen at this time.   Hyperlipidemia: . Uncontrolled, though anticipated to be improved; current treatment: rosuvastatin 10 mg daily (previously on 5 mg daily) . Continue current regimen at this time. Due for repeat labwork for lipids when patient next has labwork drawn  Patient Goals/Self-Care Activities . Over the next 90 days, patient will:  - take medications as prescribed check glucose twice daily, document, and provide at future appointments  Follow Up Plan: Telephone follow up appointment with care management team member scheduled for: ~ 12 weeks      Medication Assistance:  None required.  Patient affirms current coverage  meets needs.  Follow Up:  Patient agrees to Care Plan and Follow-up.  Plan: Telephone follow up appointment with care management team member scheduled for:  ~ 12 weeks  [name], PharmD, [place], [place] Clinical Pharmacist [employer] [facility] [employer] [phone]

## 2020-11-30 NOTE — Telephone Encounter (Signed)
Received a note from [name] that Caroline Meyers was having some episodes of light headedness.  Please clarify symptoms - may need to be evaluated.  Also having arm pain - after digging in a sea turtle nest last week.  Will need to be evaluated if arm pain persists.

## 2020-11-30 NOTE — Patient Instructions (Signed)
Visit Information  Goals Addressed              This Visit's Progress     Patient Stated   .  Medication Monitoring (pt-stated)        Patient Goals/Self-Care Activities . Over the next 90 days, patient will:  - take medications as prescribed check glucose twice daily, document, and provide at future appointments       Patient verbalizes understanding of instructions provided today and agrees to view in [place].   Plan: Telephone follow up appointment with care management team member scheduled for:  ~ 12 weeks  [name], PharmD, [place], [place] Clinical Pharmacist [employer] [facility] [employer] [phone]

## 2021-02-22 ENCOUNTER — Ambulatory Visit: Payer: BC Managed Care – PPO | Admitting: Pharmacist

## 2021-02-22 ENCOUNTER — Other Ambulatory Visit: Payer: Self-pay | Admitting: Internal Medicine

## 2021-02-22 DIAGNOSIS — E1165 Type 2 diabetes mellitus with hyperglycemia: Secondary | ICD-10-CM

## 2021-02-22 DIAGNOSIS — E7800 Pure hypercholesterolemia, unspecified: Secondary | ICD-10-CM

## 2021-02-22 NOTE — Chronic Care Management (AMB) (Signed)
Care Management   Pharmacy Note  02/22/2021 Name: Caroline Meyers MRN: [id number] DOB: January 29, 1961  Subjective: Caroline Meyers is a 60 y.o. year old female who is a primary care patient of [name], MD. The Care Management team was consulted for assistance with care management and care coordination needs.    Engaged with patient by telephone for follow up visit in response to provider referral for pharmacy case management and/or care coordination services.   The patient was given information about Care Management services today including:  Care Management services includes personalized support from designated clinical staff supervised by the patient's primary care provider, including individualized plan of care and coordination with other care providers. 24/7 contact phone numbers for assistance for urgent and routine care needs. The patient may stop case management services at any time by phone call to the office staff.  Patient agreed to services and consent obtained.  Assessment:  Review of patient status, including review of consultants reports, laboratory and other test data, was performed as part of comprehensive evaluation and provision of chronic care management services.   SDOH (Social Determinants of Health) assessments and interventions performed:  SDOH Interventions    Flowsheet Row Most Recent Value  SDOH Interventions   Financial Strain Interventions Intervention Not Indicated        Objective:  Lab Results  Component Value Date   CREATININE 0.6 08/16/2020   CREATININE 0.59 02/24/2018   CREATININE 0.56 05/19/2017    Lab Results  Component Value Date   HGBA1C 8.5 08/16/2020       Component Value Date/Time   CHOL 176 08/16/2020 0000   TRIG 174 (A) 08/16/2020 0000   HDL 37 08/16/2020 0000   CHOLHDL 4 02/24/2018 0830   VLDL 28.0 02/24/2018 0830   LDLCALC 104 08/16/2020 0000   LDLDIRECT 176.7 10/07/2012 0826     Clinical ASCVD: No  The ASCVD Risk  score Mikey Bussing DC Jr., et al., 2013) failed to calculate for the following reasons:   The systolic blood pressure is missing      BP Readings from Last 3 Encounters:  06/10/18 (!) 165/96  05/18/18 132/88  03/08/18 137/79    Care Plan  Allergies  Allergen Reactions   Shellfish Allergy Shortness Of Breath and Swelling    "throat closing"   Aspirin Hives   Penicillins    Tartrazine Hives    Medications Reviewed Today     Reviewed by [name], [name] (Pharmacist) on 02/22/21 at 1430  Med List Status: <None>   Medication Order Taking? Sig Documenting Provider Last Dose Status Informant  acetaminophen (TYLENOL) 325 MG tablet [id number] Yes Take 650 mg by mouth as needed for headache. [provider] Taking Active   glucose blood (ONETOUCH VERIO) test strip [id number] Yes CHECK BLOOD SUGAR ONCE DAILY [name], MD Taking Active   Lancets ([name]) lancets [id number] Yes Use as instructed to check blood sugars once daily. [name], MD Taking Active   metFORMIN (GLUCOPHAGE-XR) 500 MG 24 hr tablet [id number] Yes TAKE 1 TABLET BY MOUTH TWICE A Lemont Fillers, MD Taking Active   Probiotic Product (PROBIOTIC DAILY PO) [id number] Yes Take by mouth. [provider] Taking Active   rosuvastatin (CRESTOR) 10 MG tablet [id number] Yes TAKE 1 TABLET BY MOUTH EVERY DAY [name], MD Taking Active   Semaglutide,0.25 or 0.'5MG'$ /DOS, (OZEMPIC, 0.25 OR 0.5 MG/DOSE,) 2 MG/1.5ML SOPN [id number] Yes Inject 0.5 mg into the skin once a week. 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   Health care maintenance 04/25/2016   Anxiety and depression 09/01/2015   Low back pain with sciatica 11/26/2012   Sleep apnea 05/26/2012   Diabetes mellitus ([place]) 05/26/2012   Hypercholesterolemia 05/26/2012   Hypertension 05/26/2012    Conditions to  be addressed/monitored: HLD and DMII  Care Plan : Medication Monitoring  Updates made by [name], [name] since 02/22/2021 12:00 AM     Problem: Diabetes      Long-Range Goal: Disease Progression Prevention   Start Date: 09/20/2020  This Visit's Progress: On track  Recent Progress: On track  Priority: High  Note:   Current Barriers:  Unable to achieve control of diabetes   Pharmacist Clinical Goal(s):  Over the next 90 days, patient will achieve control of diabetes as evidenced by A1c  through collaboration with PharmD and provider.   Interventions: 1:1 collaboration with [name], MD regarding development and update of comprehensive plan of care as evidenced by provider attestation and co-signature Inter-disciplinary care team collaboration (see longitudinal plan of care) Comprehensive medication review performed; medication list updated in electronic medical record  Health Maintenance: Due for COVID booster.  Due for foot exam. Due for all other screening - Hep C, Mammogram, Tdap, Pneumona. Lives in [place], [place]. Does not have plans to come back to the [place] area until after September (after completion of turtle hatching season). Requests to defer lab work, PCP f/u until then.   SDOH Stress lately. Busy end of turtle hatching season - waking up around 5 am, going to bed 11 pm-12 am. 50 remaining [place] on the [place]. Season should end in about a month.   Diabetes: Uncontrolled but improved; current treatment: metformin XR 500 mg BID (max tolerated dose d/t GI upset), Ozempic 0.5 mg weekly Reports she missed a dose of Ozempic 2 weeks ago and sugars have since increased. Restarted last Sunday. Does note some continued stomach upset on 0.5 mg weekly. Current glucose readings: has not checked in the past week; fasting glucose: 150-170 Discussed lowering Ozempic dose to in between 0.25 and 0.5 mg weekly to reduce risk of stomach upset. Recommend  injecting [id number] mg + 10 clicks (about 20 clicks between [id number] and 0.5 mg doses).  Continue metformin XR 500 mg twice daily.   Hyperlipidemia: Uncontrolled, though anticipated to be improved; current treatment: rosuvastatin 10 mg daily (previously on 5 mg daily) Continue current regimen at this time. Due for repeat labwork for lipids when patient next has labwork drawn  Patient Goals/Self-Care Activities Over the next 90 days, patient will:  - take medications as prescribed check glucose twice daily, document, and provide at future appointments  Follow Up Plan: Telephone follow up appointment with care management team member scheduled for: ~ 8 weeks      Medication Assistance:  None required.  Patient affirms current coverage meets needs.  Follow Up:  Patient agrees to Care Plan and Follow-up.  Plan: Telephone follow up appointment with care management team member scheduled for:  8 weeks  [name], PharmD, [place], [place] Clinical Pharmacist [employer] [facility] [employer] [phone]

## 2021-02-22 NOTE — Patient Instructions (Signed)
Visit Information   Goals Addressed               This Visit's Progress     Patient Stated     Medication Monitoring (pt-stated)        Patient Goals/Self-Care Activities Over the next 90 days, patient will:  - take medications as prescribed check glucose twice daily, document, and provide at future appointments         Patient verbalizes understanding of instructions provided today and agrees to view in [place].   Plan: Telephone follow up appointment with care management team member scheduled for:  8 weeks  [name], PharmD, [place], [place] Clinical Pharmacist [employer] [facility] [employer] [phone]

## 2021-03-03 ENCOUNTER — Other Ambulatory Visit: Payer: Self-pay | Admitting: Internal Medicine

## 2021-03-03 DIAGNOSIS — E1165 Type 2 diabetes mellitus with hyperglycemia: Secondary | ICD-10-CM

## 2021-04-18 ENCOUNTER — Telehealth: Payer: BC Managed Care – PPO

## 2021-04-24 ENCOUNTER — Telehealth: Payer: Self-pay | Admitting: Internal Medicine

## 2021-04-24 ENCOUNTER — Ambulatory Visit: Payer: BC Managed Care – PPO | Admitting: Pharmacist

## 2021-04-24 ENCOUNTER — Telehealth: Payer: Self-pay | Admitting: Pharmacist

## 2021-04-24 DIAGNOSIS — E1165 Type 2 diabetes mellitus with hyperglycemia: Secondary | ICD-10-CM

## 2021-04-24 DIAGNOSIS — E7800 Pure hypercholesterolemia, unspecified: Secondary | ICD-10-CM

## 2021-04-24 NOTE — Patient Instructions (Signed)
[facility] Lighter,   We will mail you the order for lab work.  Continue your current regimen at this time.  We recommend you get the influenza vaccine for this season.   We recommend you get the updated bivalent COVID-19 booster, at least 2 months after any prior doses. You may consider delaying a booster dose by 3 months from a prior episode of COVID-19 per the CDC.   You can find pharmacies that have this formulation in stock at [email or web address].     Visit Information   Goals Addressed               This Visit's Progress     Patient Stated     Medication Monitoring (pt-stated)        Patient Goals/Self-Care Activities Over the next 90 days, patient will:  - take medications as prescribed check glucose twice daily, document, and provide at future appointments        Patient verbalizes understanding of instructions provided today and agrees to view in [place].   Plan: Telephone follow up appointment with care management team member scheduled for:  8 weeks  [name], PharmD, [place], [place] Clinical Pharmacist [employer] [facility] [employer] [phone]

## 2021-04-24 NOTE — Chronic Care Management (AMB) (Signed)
Chronic Care Management Pharmacy Note  04/24/2021 Name:  Caroline Meyers MRN:  [id number] DOB:  1960/08/02  Subjective: Caroline Meyers is an 60 y.o. year old female who is a primary patient of Caroline Pheasant, MD.  The CCM team was consulted for assistance with disease management and care coordination needs.    Engaged with patient by telephone for follow up visit for pharmacy case management and/or care coordination services.   Objective:  Medications Reviewed Today     Reviewed by [name], [name] (Pharmacist) on 04/24/21 at [phone]  Med List Status: <None>   Medication Order Taking? Sig Documenting Provider Last Dose Status Informant  acetaminophen (TYLENOL) 325 MG tablet [id number] Yes Take 650 mg by mouth as needed for headache. [provider] Taking Active   glucose blood (ONETOUCH VERIO) test strip [id number]  CHECK BLOOD SUGAR ONCE DAILY Caroline Pheasant, MD  Active   Lancets [facility] ULTRASOFT) lancets [id number]  Use as instructed to check blood sugars once daily. Caroline Pheasant, MD  Active   metFORMIN (GLUCOPHAGE-XR) 500 MG 24 hr tablet [id number] Yes TAKE 1 TABLET BY MOUTH TWICE A DAY [name], [name], MD Taking Active   OZEMPIC, 0.25 OR 0.5 MG/DOSE, 2 MG/1.5ML SOPN [id number] Yes INJECT 0.5 MG INTO THE SKIN ONCE A WEEK. Caroline Pheasant, MD Taking Active   Probiotic Product (PROBIOTIC DAILY PO) [id number] Yes Take by mouth. [provider] Taking Active   rosuvastatin (CRESTOR) 10 MG tablet [id number] Yes TAKE 1 TABLET BY MOUTH EVERY DAY Caroline Pheasant, MD Taking Active            Lab Results  Component Value Date   HGBA1C 8.5 08/16/2020   Lab Results  Component Value Date   CREATININE 0.6 08/16/2020   BUN 16 08/16/2020   NA 136 (A) 08/16/2020   K 4.3 08/16/2020   CL 101 08/16/2020   CO2 26 (A) 08/16/2020   Lab Results  Component Value Date   CHOL 176 08/16/2020   HDL 37 08/16/2020   LDLCALC 104 08/16/2020   LDLDIRECT 176.7 10/07/2012    TRIG 174 (A) 08/16/2020   CHOLHDL 4 02/24/2018     Assessment/Interventions: Review of patient past medical history, allergies, medications, health status, including review of consultants reports, laboratory and other test data, was performed as part of comprehensive evaluation and provision of chronic care management services.   SDOH:  (Social Determinants of Health) assessments and interventions performed: Yes SDOH Interventions    Flowsheet Row Most Recent Value  SDOH Interventions   SDOH Interventions for the Following Domains Financial Strain, Stress  Financial Strain Interventions Intervention Not Indicated        CCM Care Plan  Review of patient past medical history, allergies, medications, health status, including review of consultants reports, laboratory and other test data, was performed as part of comprehensive evaluation and provision of chronic care management services.   Conditions to be addressed/monitored:  Hyperlipidemia and Diabetes  Care Plan : Medication Monitoring  Updates made by [name], [name] since 04/24/2021 12:00 AM     Problem: Diabetes      Long-Range Goal: Disease Progression Prevention   Start Date: 09/20/2020  This Visit's Progress: On track  Recent Progress: On track  Priority: High  Note:   Current Barriers:  Unable to achieve control of diabetes   Pharmacist Clinical Goal(s):  Over the next 90 days, patient will achieve control of diabetes as evidenced by A1c  through collaboration with PharmD and provider.  Interventions: 1:1 collaboration with Caroline Pheasant, MD regarding development and update of comprehensive plan of care as evidenced by provider attestation and co-signature Inter-disciplinary care team collaboration (see longitudinal plan of care) Comprehensive medication review performed; medication list updated in electronic medical record  Health Maintenance   Yearly diabetic eye exam: due Yearly diabetic  foot exam: due Urine microalbumin: due Yearly influenza vaccination: due Td/Tdap vaccination: due Pneumonia vaccination: due COVID vaccinations: due Shingrix vaccinations: due Colonoscopy: due Mammogram: due Requested deferring PCP follow up until early February. Scheduled physical follow up. Discussed need for comprehensive lab work. Patient amenable to having drawn at local hospital in [place]. Will collaborate w/ PCP to order.    SDOH: Reports 2 recent deaths in the family that have increased stress.   Diabetes: Uncontrolled but improved; current treatment: metformin XR 500 mg BID (max tolerated dose d/t GI upset), Ozempic 0.5 mg weekly Reports soft bowel movements prior to taking Ozempic, now reports some straining, constipation. Has had a couple of bloody bowel movements, but believes this is related to hemorrhoids as she reports a history after giving birth. Reports she is not concerned, refuses evaluation. Denies lightheadedness, dizziness, shortness of breath. Reviewed that if bowel movements become dark, tarry, sticky that she seek urgent evaluation. She verbalized understanding Current glucose readings: has not checked in the past week; fasting glucose: 140-150s Reports she has not been watching her diet or focusing on physical activity lately.  Recommended to continue current regimen at this time. Follow up lab work as scheduled.    Hyperlipidemia: Uncontrolled, though anticipated to be improved; current treatment: rosuvastatin 10 mg daily (previously on 5 mg daily) Continue current regimen at this time. Due for repeat labwork for lipids when patient next has labwork drawn  Patient Goals/Self-Care Activities Over the next 90 days, patient will:  - take medications as prescribed check glucose twice daily, document, and provide at future appointments  Follow Up Plan: Telephone follow up appointment with care management team member scheduled for: ~ 8 weeks       Plan:  Telephone follow up appointment with care management team member scheduled for:  8 weeks  Caroline Meyers, PharmD, [place], [place] Clinical Pharmacist [employer] [facility] [employer] [phone]

## 2021-04-24 NOTE — Telephone Encounter (Signed)
Received notification from [name] - pt with bowel change and blood.  Please call and see how she is doing.  If bleeding and bowel change, needs to be seen.

## 2021-04-24 NOTE — Telephone Encounter (Signed)
Patient agreeable to lab work, reports last year she went to the hospital in [place] to have labs drawn.   Scheduled in office follow up with Dr. Nicki Reaper in early February per patient's preference.

## 2021-04-25 NOTE — Telephone Encounter (Signed)
Attempted to reach patient. Unable to leave message.

## 2021-05-08 NOTE — Telephone Encounter (Signed)
Spoke with patient to discuss. This is not something that is persistent. She noticed a change in her bowels with ozempic- causing some constipation. No abd pain, nausea, vomiting, bloody stools. She has had hemorrhoids before and says that the blood that she noticed she thinks is related to this because she was having to strain to have a bowel movement. Confirmed no large amount of blood. Blood was bright red. Advised if this changes or she develops any new/persistent symptoms- she needs to be evaluated more acutely. Patient agreed.

## 2021-05-08 NOTE — Telephone Encounter (Signed)
Labs ordered and printed to mail to patient after signature.

## 2021-05-30 ENCOUNTER — Other Ambulatory Visit: Payer: Self-pay | Admitting: Internal Medicine

## 2021-05-30 DIAGNOSIS — E1165 Type 2 diabetes mellitus with hyperglycemia: Secondary | ICD-10-CM

## 2021-06-11 ENCOUNTER — Ambulatory Visit: Payer: BC Managed Care – PPO | Admitting: Pharmacist

## 2021-06-11 DIAGNOSIS — E7800 Pure hypercholesterolemia, unspecified: Secondary | ICD-10-CM

## 2021-06-11 DIAGNOSIS — E1165 Type 2 diabetes mellitus with hyperglycemia: Secondary | ICD-10-CM

## 2021-06-11 NOTE — Chronic Care Management (AMB) (Signed)
Chronic Care Management CCM Pharmacy Note  06/11/2021 Name:  Caroline AMRHEIN MRN:  [id number] DOB:  [birth year]  Summary: - Reports elevated glucose readings. Requests to increase Ozempic to 0.5 mg weekly  Recommendations/Changes made from today's visit: - Increase Ozempic to 0.5 mg weekly. Get labs drawn. Follow up with PCP in February.   Subjective: TALEEYA Meyers is an 60 y.o. year old female who is a primary patient of Caroline Pheasant, MD.  The CCM team was consulted for assistance with disease management and care coordination needs.    Engaged with patient by telephone for follow up visit for pharmacy case management and/or care coordination services.   Objective:  Medications Reviewed Today     Reviewed by [name], [name] (Pharmacist) on 06/11/21 at [phone]  Med List Status: <None>   Medication Order Taking? Sig Documenting Provider Last Dose Status Informant  acetaminophen (TYLENOL) 325 MG tablet [id number]  Take 650 mg by mouth as needed for headache. [provider]  Active   glucose blood (ONETOUCH VERIO) test strip [id number]  CHECK BLOOD SUGAR ONCE DAILY Caroline Pheasant, MD  Active   Lancets [facility] ULTRASOFT) lancets [id number]  Use as instructed to check blood sugars once daily. Caroline Pheasant, MD  Active   metFORMIN (GLUCOPHAGE-XR) 500 MG 24 hr tablet [id number] Yes TAKE 1 TABLET BY MOUTH TWICE A DAY Caroline, [name], MD Taking Active   OZEMPIC, 0.25 OR 0.5 MG/DOSE, 2 MG/1.5ML SOPN [id number] Yes INJECT 0.5 MG INTO THE SKIN ONCE A WEEK. Caroline Pheasant, MD Taking Active   Probiotic Product (PROBIOTIC DAILY PO) [id number] Yes Take by mouth. [provider] Taking Active   rosuvastatin (CRESTOR) 10 MG tablet [id number] Yes TAKE 1 TABLET BY MOUTH EVERY DAY Caroline Pheasant, MD Taking Active             Pertinent Labs:   Lab Results  Component Value Date   HGBA1C 8.5 08/16/2020   Lab Results  Component Value Date   CHOL 176 08/16/2020   HDL 37  08/16/2020   LDLCALC 104 08/16/2020   LDLDIRECT 176.7 10/07/2012   TRIG 174 (A) 08/16/2020   CHOLHDL 4 02/24/2018   Lab Results  Component Value Date   CREATININE 0.6 08/16/2020   BUN 16 08/16/2020   NA 136 (A) 08/16/2020   K 4.3 08/16/2020   CL 101 08/16/2020   CO2 26 (A) 08/16/2020    SDOH:  (Social Determinants of Health) assessments and interventions performed:  SDOH Interventions    Flowsheet Row Most Recent Value  SDOH Interventions   Financial Strain Interventions Intervention Not Indicated       CCM Care Plan  Review of patient past medical history, allergies, medications, health status, including review of consultants reports, laboratory and other test data, was performed as part of comprehensive evaluation and provision of chronic care management services.   Care Plan : Medication Monitoring  Updates made by [name], [name] since 06/11/2021 12:00 AM     Problem: Diabetes      Long-Range Goal: Disease Progression Prevention   Start Date: 09/20/2020  Recent Progress: On track  Priority: High  Note:   Current Barriers:  Unable to achieve control of diabetes   Pharmacist Clinical Goal(s):  Over the next 90 days, patient will achieve control of diabetes as evidenced by A1c  through collaboration with PharmD and provider.   Interventions: 1:1 collaboration with Caroline Pheasant, MD regarding development and update of comprehensive plan of care as evidenced  by provider attestation and co-signature Inter-disciplinary care team collaboration (see longitudinal plan of care) Comprehensive medication review performed; medication list updated in electronic medical record  Health Maintenance   Yearly diabetic eye exam: due Yearly diabetic foot exam: due Urine microalbumin: due Yearly influenza vaccination: due - encouraged to pursue this season Td/Tdap vaccination: due Pneumonia vaccination: due COVID vaccinations: due - patient hesitant for bivalent  booster. Encouraged to consider Shingrix vaccinations: due Colonoscopy: due Mammogram: due Reviewed need for updated lab work. She has not had a chance to get them checked yet. She anticipates she can go get these checked after the holidays. Encouraged to do so.     Diabetes: Uncontrolled but improved; current treatment: metformin XR 500 mg BID (max tolerated dose d/t GI upset), Ozempic 0.25 mg weekly + ~ 10 clicks - reports today that she has been injecting less than 0.5 mg weekly  Reports she has been focused on hydration, fibers to reduce constipation and straining.  Current glucose readings: has not checked in the past week; fasting glucose: 130-160s Reports she has not been watching her diet or focusing on physical activity lately. Lots going on with family members being sick, holidays Patient requests to increase Ozempic to 0.5 mg weekly. Continue metformin XR 500 mg BID. Advised to focus on methods to reduce risk of constipation. Patient verbalized understanding.    Hyperlipidemia: Uncontrolled, though anticipated to be improved; current treatment: rosuvastatin 10 mg daily Continue current regimen at this time. Due for repeat labwork for lipids when patient next has labwork drawn  Patient Goals/Self-Care Activities Over the next 90 days, patient will:  - take medications as prescribed check glucose twice daily, document, and provide at future appointments      Plan: Video visit scheduled in ~ 12 weeks  [name], PharmD, [place], CPP Clinical Pharmacist [employer] at [facility] [employer] [phone]

## 2021-06-11 NOTE — Patient Instructions (Signed)
Visit Information  Following are the goals we discussed today:  Patient Goals/Self-Care Activities Over the next 90 days, patient will:  - take medications as prescribed check glucose twice daily, document, and provide at future appointments        Plan: Video visit scheduled in ~ 12 weeks   [name], PharmD, [place], CPP Clinical Pharmacist [place] at [facility] [phone]     Please call the care guide team at [phone] if you need to cancel or reschedule your appointment.   Patient verbalizes understanding of instructions provided today and agrees to view in [place].

## 2021-07-30 LAB — CBC AND DIFFERENTIAL
HCT: 42 (ref 36–46)
Hemoglobin: 14.6 (ref 12.0–16.0)
Platelets: 379 (ref 150–399)
WBC: 7.2

## 2021-07-30 LAB — BASIC METABOLIC PANEL
BUN: 14 (ref 4–21)
CO2: 27 — AB (ref 13–22)
Chloride: 103 (ref 99–108)
Creatinine: 0.7 (ref 0.5–1.1)
Glucose: 139
Potassium: 4.3 (ref 3.4–5.3)
Sodium: 136 — AB (ref 137–147)

## 2021-07-30 LAB — LIPID PANEL
Cholesterol: 151 (ref 0–200)
HDL: 39 (ref 35–70)
LDL Cholesterol: 78
Triglycerides: 168 — AB (ref 40–160)

## 2021-07-30 LAB — HEPATIC FUNCTION PANEL
ALT: 19 (ref 7–35)
AST: 19 (ref 13–35)
Bilirubin, Direct: 0.1 (ref 0.01–0.4)
Bilirubin, Total: 0.9

## 2021-07-30 LAB — COMPREHENSIVE METABOLIC PANEL
Albumin: 4.2 (ref 3.5–5.0)
Calcium: 9.5 (ref 8.7–10.7)

## 2021-07-30 LAB — HEMOGLOBIN A1C: Hemoglobin A1C: 6.8

## 2021-07-30 LAB — TSH: TSH: 7.5 — AB (ref 0.41–5.90)

## 2021-07-30 LAB — CBC: RBC: 4.7 (ref 3.87–5.11)

## 2021-08-02 ENCOUNTER — Telehealth: Payer: Self-pay

## 2021-08-02 NOTE — Telephone Encounter (Signed)
Labs abstracted. See chart. Placed copy in folder

## 2021-08-02 NOTE — Telephone Encounter (Signed)
Called patient. Unable to leave message.

## 2021-08-02 NOTE — Telephone Encounter (Signed)
Notify - cholesterol levels are ok.  overall sugar control improved - a1c 6.8.  continue low carb diet and exercise.  We will follow.  TSH is slightly elevated.  Not on any thyroid medication.  When slightly elevated, need to follow.  Needs repeat TSH in 6 weeks.  Hgb, kidney function tests and liver function tests are wnl.

## 2021-08-06 NOTE — Telephone Encounter (Signed)
Patient has appt tomorrow

## 2021-08-07 ENCOUNTER — Other Ambulatory Visit (HOSPITAL_COMMUNITY)
Admission: RE | Admit: 2021-08-07 | Discharge: 2021-08-07 | Disposition: A | Discharge status: Home / Self Care | Payer: BC Managed Care – PPO | Source: Ambulatory Visit | Attending: Internal Medicine | Admitting: Internal Medicine

## 2021-08-07 ENCOUNTER — Ambulatory Visit: Payer: BC Managed Care – PPO | Admitting: Internal Medicine

## 2021-08-07 ENCOUNTER — Other Ambulatory Visit: Payer: Self-pay

## 2021-08-07 VITALS — BP 136/80 | HR 83 | Temp 97.9°F | Resp 16 | Ht 62.0 in | Wt 200.4 lb

## 2021-08-07 DIAGNOSIS — Z124 Encounter for screening for malignant neoplasm of cervix: Secondary | ICD-10-CM | POA: Diagnosis not present

## 2021-08-07 DIAGNOSIS — Z0000 Encounter for general adult medical examination without abnormal findings: Secondary | ICD-10-CM

## 2021-08-07 DIAGNOSIS — I10 Essential (primary) hypertension: Secondary | ICD-10-CM

## 2021-08-07 DIAGNOSIS — E7800 Pure hypercholesterolemia, unspecified: Secondary | ICD-10-CM

## 2021-08-07 DIAGNOSIS — E1165 Type 2 diabetes mellitus with hyperglycemia: Secondary | ICD-10-CM | POA: Diagnosis not present

## 2021-08-07 DIAGNOSIS — K5900 Constipation, unspecified: Secondary | ICD-10-CM

## 2021-08-07 LAB — HM DIABETES FOOT EXAM

## 2021-08-07 NOTE — Patient Instructions (Signed)
Let me know which podiatrist and gastroenterologist you want to see.   Also, let me know where to send the order for the mammogram.

## 2021-08-07 NOTE — Progress Notes (Signed)
Patient ID: Caroline Meyers, female   DOB: [birth year], 61 y.o.   MRN: [id number]   Subjective:    Patient ID: Caroline Meyers, female    DOB: 1961-06-16, 62 y.o.   MRN: [id number]  This visit occurred during the SARS-CoV-2 public health emergency.  Safety protocols were in place, including screening questions prior to the visit, additional usage of staff PPE, and extensive cleaning of exam room while observing appropriate contact time as indicated for disinfecting solutions.   Patient here for her physical exam.   Chief Complaint  Patient presents with   Annual Exam   .   HPI On ozempic.  Has lost 25 pounds.  Did report issue with previous constipation.  Noticed some BRB on tissue with straining - previously.  No other bleeding.  No abdominal pain.  Discussed the need for GI evaluation.  No chest pain.  Breathing stable.  No increased cough or congestion.  Discussed diet and exercise.     Past Medical History:  Diagnosis Date   Achilles tendon tear    age 65   Environmental allergies    Hypercholesteremia    Hyperglycemia    Hypertension    Pre-diabetes    Sleep apnea    Past Surgical History:  Procedure Laterality Date   BREAST BIOPSY Right 2008   cyst removed   COLONOSCOPY WITH PROPOFOL N/A 03/08/2018   Procedure: COLONOSCOPY WITH PROPOFOL;  Surgeon: Jonathon Bellows, MD;  Location[facility] ENDOSCOPY;  Service: Gastroenterology;  Laterality: N/A;   lumbar microdiskectomy  07/03/09   Family History  Problem Relation Age of Onset   Alzheimer's disease Other        grandmother   Heart disease Other        myocardial infarction (77s)- grandfather   Hypercholesterolemia Mother    Hypercholesterolemia Sister    Breast cancer Neg Hx    Colon cancer Neg Hx    Social History   Socioeconomic History   Marital status: Married    Spouse name: Not on file   Number of children: 1   Years of education: Not on file   Highest education level: Not on file  Occupational History   Occupation:  Product manager: [employer]  Tobacco Use   Smoking status: Never   Smokeless tobacco: Never  Substance and Sexual Activity   Alcohol use: No    Alcohol/week: 0.0 standard drinks   Drug use: Yes    Types: Hydrocodone   Sexual activity: Not on file  Other Topics Concern   Not on file  Social History Narrative   Not on file   Social Determinants of Health   Financial Resource Strain: Low Risk    Difficulty of Paying Living Expenses: Not hard at all  Food Insecurity: Not on file  Transportation Needs: Not on file  Physical Activity: Not on file  Stress: Not on file  Social Connections: Not on file     Review of Systems  Constitutional:  Negative for appetite change and unexpected weight change.  HENT:  Negative for congestion, sinus pressure and sore throat.   Eyes:  Negative for pain and visual disturbance.  Respiratory:  Negative for cough, chest tightness and shortness of breath.   Cardiovascular:  Negative for chest pain, palpitations and leg swelling.  Gastrointestinal:  Positive for constipation. Negative for abdominal pain, diarrhea, nausea and vomiting.  Genitourinary:  Negative for difficulty urinating and dysuria.  Musculoskeletal:  Negative for joint swelling and  myalgias.  Skin:  Negative for color change and rash.  Neurological:  Negative for dizziness, light-headedness and headaches.  Hematological:  Negative for adenopathy. Does not bruise/bleed easily.  Psychiatric/Behavioral:  Negative for agitation and dysphoric mood.       Objective:     BP 136/80    Pulse 83    Temp 97.9 F (36.6 C)    Resp 16    Ht 5\' 2"  (1.575 m)    Wt 200 lb 6.4 oz (90.9 kg)    LMP 03/09/2012    SpO2 97%    BMI 36.65 kg/m  Wt Readings from Last 3 Encounters:  08/07/21 200 lb 6.4 oz (90.9 kg)  08/08/19 225 lb (102.1 kg)  05/18/18 225 lb 9.6 oz (102.3 kg)    Physical Exam Vitals reviewed.  Constitutional:      General: She is not in acute  distress.    Appearance: Normal appearance. She is well-developed.  HENT:     Head: Normocephalic and atraumatic.     Right Ear: External ear normal.     Left Ear: External ear normal.  Eyes:     General: No scleral icterus.       Right eye: No discharge.        Left eye: No discharge.     Conjunctiva/sclera: Conjunctivae normal.  Neck:     Thyroid: No thyromegaly.  Cardiovascular:     Rate and Rhythm: Normal rate and regular rhythm.  Pulmonary:     Effort: No tachypnea, accessory muscle usage or respiratory distress.     Breath sounds: Normal breath sounds. No decreased breath sounds or wheezing.  Chest:  Breasts:    Right: No inverted nipple, mass, nipple discharge or tenderness (no axillary adenopathy).     Left: No inverted nipple, mass, nipple discharge or tenderness (no axilarry adenopathy).  Abdominal:     General: Bowel sounds are normal.     Palpations: Abdomen is soft.     Tenderness: There is no abdominal tenderness.  Genitourinary:    Comments: Normal external genitalia.  Vaginal vault without lesions.  Cervix identified.  Pap smear performed.  Could not appreciate any adnexal masses or tenderness.   Musculoskeletal:        General: No swelling or tenderness.     Cervical back: Neck supple.  Lymphadenopathy:     Cervical: No cervical adenopathy.  Skin:    Findings: No erythema or rash.  Neurological:     Mental Status: She is alert and oriented to person, place, and time.  Psychiatric:        Mood and Affect: Mood normal.        Behavior: Behavior normal.     Outpatient Encounter Medications as of 08/07/2021  Medication Sig   acetaminophen (TYLENOL) 325 MG tablet Take 650 mg by mouth as needed for headache.   glucose blood (ONETOUCH VERIO) test strip CHECK BLOOD SUGAR ONCE DAILY   Lancets (ONETOUCH ULTRASOFT) lancets Use as instructed to check blood sugars once daily.   metFORMIN (GLUCOPHAGE-XR) 500 MG 24 hr tablet TAKE 1 TABLET BY MOUTH TWICE A DAY    OZEMPIC, 0.25 OR 0.5 MG/DOSE, 2 MG/1.5ML SOPN INJECT 0.5 MG INTO THE SKIN ONCE A WEEK.   Probiotic Product (PROBIOTIC DAILY PO) Take by mouth.   rosuvastatin (CRESTOR) 10 MG tablet TAKE 1 TABLET BY MOUTH EVERY DAY   No facility-administered encounter medications on file as of 08/07/2021.     Lab Results  Component Value Date  WBC 7.2 07/30/2021   HGB 14.6 07/30/2021   HCT 42 07/30/2021   PLT 379 07/30/2021   GLUCOSE 141 (H) 02/24/2018   CHOL 151 07/30/2021   TRIG 168 (A) 07/30/2021   HDL 39 07/30/2021   LDLDIRECT 176.7 10/07/2012   LDLCALC 78 07/30/2021   ALT 19 07/30/2021   AST 19 07/30/2021   NA 136 (A) 07/30/2021   K 4.3 07/30/2021   CL 103 07/30/2021   CREATININE 0.7 07/30/2021   BUN 14 07/30/2021   CO2 27 (A) 07/30/2021   TSH 7.50 (A) 07/30/2021   HGBA1C 6.8 07/30/2021   MICROALBUR 2.4 (H) 05/19/2017       Assessment & Plan:   Problem List Items Addressed This Visit     Constipation    Problems with constipation as outlined.  Stay hydrated.  Exercise.  Consider miralax.  Follow on ozempic.  Needs GI referral as outlined.       Diabetes mellitus ([place])    Low carb diet and exercise.  On metformin, but only taking one per day.  Bowels better on the lower dose.  Will continue metformin 500mg  bid.  Have her spot check her sugar and send in readings.  On ozempic.  Has lost weight.  a1c improved.  Discussed constipation.  Feels doing ok.  Wants to continue.  Follow.        Health care maintenance    Physical today 08/07/21.  PAP 08/07/21.  Has declined mammogram.  Overdue colonoscopy.  With constipation and BRB - needs GI evaluation.  Will call me with name of GI prefers to see.        Hypercholesterolemia    On crestor.  Low cholesterol diet and exercise.  Follow lipid panel and liver function tests.  LDL 78.        Hypertension    Spot check pressures.  On no medication.  If elevated, start ARB or ACE inhibitor.        Other Visit Diagnoses     Routine general  medical examination at a health care facility    -  Primary   Cervical cancer screening       Relevant Orders   Cytology - PAP( ) (Completed)        [name], MD

## 2021-08-09 LAB — CYTOLOGY - PAP
Adequacy: ABSENT
Comment: NEGATIVE
Diagnosis: NEGATIVE
High risk HPV: NEGATIVE

## 2021-08-11 ENCOUNTER — Encounter: Payer: Self-pay | Admitting: Internal Medicine

## 2021-08-11 DIAGNOSIS — K5900 Constipation, unspecified: Secondary | ICD-10-CM | POA: Insufficient documentation

## 2021-08-11 NOTE — Assessment & Plan Note (Signed)
Problems with constipation as outlined.  Stay hydrated.  Exercise.  Consider miralax.  Follow on ozempic.  Needs GI referral as outlined.

## 2021-08-11 NOTE — Assessment & Plan Note (Signed)
Spot check pressures.  On no medication.  If elevated, start ARB or ACE inhibitor.

## 2021-08-11 NOTE — Assessment & Plan Note (Signed)
On crestor.  Low cholesterol diet and exercise.  Follow lipid panel and liver function tests.  LDL 78.

## 2021-08-11 NOTE — Assessment & Plan Note (Addendum)
Low carb diet and exercise.  On metformin, but only taking one per day.  Bowels better on the lower dose.  Will continue metformin 500mg  bid.  Have her spot check her sugar and send in readings.  On ozempic.  Has lost weight.  a1c improved.  Discussed constipation.  Feels doing ok.  Wants to continue.  Follow.

## 2021-08-11 NOTE — Assessment & Plan Note (Signed)
Physical today 08/07/21.  PAP 08/07/21.  Has declined mammogram.  Overdue colonoscopy.  With constipation and BRB - needs GI evaluation.  Will call me with name of GI prefers to see.

## 2021-08-12 ENCOUNTER — Other Ambulatory Visit: Payer: Self-pay

## 2021-08-12 ENCOUNTER — Encounter: Payer: Self-pay | Admitting: Internal Medicine

## 2021-08-12 DIAGNOSIS — E1165 Type 2 diabetes mellitus with hyperglycemia: Secondary | ICD-10-CM

## 2021-08-19 ENCOUNTER — Other Ambulatory Visit: Payer: Self-pay | Admitting: Internal Medicine

## 2021-09-03 ENCOUNTER — Ambulatory Visit: Payer: Self-pay | Admitting: Pharmacist

## 2021-09-03 NOTE — Chronic Care Management (AMB) (Signed)
?  Chronic Care Management  ? ?Note ? ?09/03/2021 ?Name: Caroline Meyers MRN: [id number] DOB: April 06, 1961 ? ? ? ?Closing pharmacy CCM case at this time. Will collaborate with Care Guide to outreach to schedule follow up with RN CM. Patient has clinic contact information for future questions or concerns.  ? ?[name], PharmD, [place], CPP ?Clinical Pharmacist ?Therapist, music at [facility] ?[phone] ? ?

## 2021-09-04 ENCOUNTER — Telehealth: Payer: Self-pay

## 2021-09-04 NOTE — Progress Notes (Signed)
error 

## 2021-09-05 ENCOUNTER — Other Ambulatory Visit: Payer: Self-pay | Admitting: Internal Medicine

## 2021-09-05 DIAGNOSIS — E1165 Type 2 diabetes mellitus with hyperglycemia: Secondary | ICD-10-CM

## 2021-09-19 ENCOUNTER — Telehealth: Payer: BC Managed Care – PPO

## 2021-11-16 ENCOUNTER — Other Ambulatory Visit: Payer: Self-pay | Admitting: Internal Medicine

## 2021-12-05 ENCOUNTER — Telehealth (INDEPENDENT_AMBULATORY_CARE_PROVIDER_SITE_OTHER): Payer: BC Managed Care – PPO | Admitting: Internal Medicine

## 2021-12-05 ENCOUNTER — Encounter: Payer: Self-pay | Admitting: Internal Medicine

## 2021-12-05 ENCOUNTER — Telehealth: Payer: BC Managed Care – PPO | Admitting: Internal Medicine

## 2021-12-05 VITALS — Ht 62.0 in | Wt 200.0 lb

## 2021-12-05 DIAGNOSIS — I10 Essential (primary) hypertension: Secondary | ICD-10-CM | POA: Diagnosis not present

## 2021-12-05 DIAGNOSIS — F419 Anxiety disorder, unspecified: Secondary | ICD-10-CM | POA: Diagnosis not present

## 2021-12-05 DIAGNOSIS — F32A Depression, unspecified: Secondary | ICD-10-CM

## 2021-12-05 DIAGNOSIS — Z1211 Encounter for screening for malignant neoplasm of colon: Secondary | ICD-10-CM

## 2021-12-05 DIAGNOSIS — E1165 Type 2 diabetes mellitus with hyperglycemia: Secondary | ICD-10-CM

## 2021-12-05 DIAGNOSIS — Z1231 Encounter for screening mammogram for malignant neoplasm of breast: Secondary | ICD-10-CM

## 2021-12-05 DIAGNOSIS — E7800 Pure hypercholesterolemia, unspecified: Secondary | ICD-10-CM | POA: Diagnosis not present

## 2021-12-05 NOTE — Progress Notes (Unsigned)
Patient ID: Caroline Meyers, female   DOB: 08/24/1960, 61 y.o.   MRN: [id number]   Virtual Visit via video Note  All issues noted in this document were discussed and addressed.  No physical exam was performed (except for noted visual exam findings with Video Visits).   I connected with Tauriel Scronce today by a video enabled telemedicine application and verified that I am speaking with the correct person using two identifiers. Location patient: home Location provider: work Persons participating in the virtual visit: patient, provider  The limitations, risks, security and privacy concerns of performing an evaluation and management service by video and the availability of in person appointments have been discussed. It has also been discussed with the patient that there may be a patient responsible charge related to this service. The patient expressed understanding and agreed to proceed.   Reason for visit: follow up appt  HPI: Follow up regarding her blood sugar and cholesterol.  On ozempic and metformin.  States this am sugars 115.  Reports highest 121-125.  Has been inactive.  Eating junk food.  No chest pain or sob reported.  No abdominal pain.  Previous issues with constipation.  Previous noticed of BRBPR.  Have discussed GI evaluation.  Handling stress.     ROS: See pertinent positives and negatives per HPI.  Past Medical History:  Diagnosis Date   Achilles tendon tear    age 41   Environmental allergies    Hypercholesteremia    Hyperglycemia    Hypertension    Pre-diabetes    Sleep apnea     Past Surgical History:  Procedure Laterality Date   BREAST BIOPSY Right 2008   cyst removed   COLONOSCOPY WITH PROPOFOL N/A 03/08/2018   Procedure: COLONOSCOPY WITH PROPOFOL;  Surgeon: Jonathon Bellows, MD;  Location[facility] ENDOSCOPY;  Service: Gastroenterology;  Laterality: N/A;   lumbar microdiskectomy  07/03/09    Family History  Problem Relation Age of Onset   Alzheimer's disease Other         grandmother   Heart disease Other        myocardial infarction (62s)- grandfather   Hypercholesterolemia Mother    Hypercholesterolemia Sister    Breast cancer Neg Hx    Colon cancer Neg Hx     SOCIAL HX: reviewed.    Current Outpatient Medications:    acetaminophen (TYLENOL) 325 MG tablet, Take 650 mg by mouth as needed for headache., Disp: , Rfl:    glucose blood (ONETOUCH VERIO) test strip, CHECK BLOOD SUGAR ONCE DAILY, Disp: 100 each, Rfl: 6   Lancets (ONETOUCH ULTRASOFT) lancets, Use as instructed to check blood sugars once daily., Disp: 100 each, Rfl: 12   metFORMIN (GLUCOPHAGE-XR) 500 MG 24 hr tablet, TAKE 1 TABLET BY MOUTH TWICE A DAY, Disp: 180 tablet, Rfl: 1   OZEMPIC, 0.25 OR 0.5 MG/DOSE, 2 MG/1.5ML SOPN, INJECT 0.5 MG INTO THE SKIN ONCE A WEEK., Disp: 1.5 mL, Rfl: 2   Probiotic Product (PROBIOTIC DAILY PO), Take by mouth., Disp: , Rfl:    rosuvastatin (CRESTOR) 10 MG tablet, TAKE 1 TABLET BY MOUTH EVERY DAY, Disp: 90 tablet, Rfl: 1  EXAM:  GENERAL: alert, oriented, appears well and in no acute distress  HEENT: atraumatic, conjunttiva clear, no obvious abnormalities on inspection of external nose and ears  NECK: normal movements of the head and neck  LUNGS: on inspection no signs of respiratory distress, breathing rate appears normal, no obvious gross SOB, gasping or wheezing  CV: no obvious  cyanosis  PSYCH/NEURO: pleasant and cooperative, no obvious depression or anxiety, speech and thought processing grossly intact  ASSESSMENT AND PLAN:  Discussed the following assessment and plan:  Problem List Items Addressed This Visit     Anxiety and depression    Overall appears to be handling things relatively well.  Follow.        Breast cancer screening    Due mammogram.  Need info to fax order.        Colon cancer screening    Discussed the need for colonoscopy.  Will get me the name of GI close to where she lives to get scheduled.        Diabetes mellitus  ([place]) - Primary    Low carb diet and exercise.  Discussed.  On metformin and ozempic.  Follow met b and a1c.  Due labs.  Will get me the information to fax orders to her.        Hypercholesterolemia    On crestor.  Low cholesterol diet and exercise.  Follow lipid panel and liver function tests.         Hypertension    Needs to spot check pressures.  On no medication.  If elevated, start ARB or ACE inhibitor.         Return in about 3 months (around 03/07/2022) for follow up appt (76mn).   I discussed the assessment and treatment plan with the patient. The patient was provided an opportunity to ask questions and all were answered. The patient agreed with the plan and demonstrated an understanding of the instructions.   The patient was advised to call back or seek an in-person evaluation if the symptoms worsen or if the condition fails to improve as anticipated.    CEinar Pheasant MD

## 2021-12-08 ENCOUNTER — Encounter: Payer: Self-pay | Admitting: Internal Medicine

## 2021-12-08 DIAGNOSIS — Z1211 Encounter for screening for malignant neoplasm of colon: Secondary | ICD-10-CM | POA: Insufficient documentation

## 2021-12-08 DIAGNOSIS — Z1239 Encounter for other screening for malignant neoplasm of breast: Secondary | ICD-10-CM | POA: Insufficient documentation

## 2021-12-08 NOTE — Assessment & Plan Note (Signed)
Low carb diet and exercise.  Discussed.  On metformin and ozempic.  Follow met b and a1c.  Due labs.  Will get me the information to fax orders to her.

## 2021-12-08 NOTE — Assessment & Plan Note (Signed)
Needs to spot check pressures.  On no medication.  If elevated, start ARB or ACE inhibitor.

## 2021-12-08 NOTE — Assessment & Plan Note (Signed)
Discussed the need for colonoscopy.  Will get me the name of GI close to where she lives to get scheduled.

## 2021-12-08 NOTE — Assessment & Plan Note (Signed)
Overall appears to be handling things relatively well.  Follow.   

## 2021-12-08 NOTE — Assessment & Plan Note (Addendum)
On crestor.  Low cholesterol diet and exercise.  Follow lipid panel and liver function tests.   

## 2021-12-08 NOTE — Assessment & Plan Note (Signed)
Due mammogram.  Need info to fax order.

## 2021-12-26 ENCOUNTER — Other Ambulatory Visit: Payer: Self-pay | Admitting: Internal Medicine

## 2021-12-26 DIAGNOSIS — E1165 Type 2 diabetes mellitus with hyperglycemia: Secondary | ICD-10-CM

## 2022-03-24 ENCOUNTER — Telehealth: Payer: Self-pay | Admitting: Internal Medicine

## 2022-03-24 NOTE — Telephone Encounter (Signed)
I called trying to reach patient but VM was not set up. Will try back at a later time.

## 2022-03-24 NOTE — Telephone Encounter (Signed)
Are you okay with this? Please advise & I am happy to order labs & get patient scheduled.

## 2022-03-24 NOTE — Telephone Encounter (Signed)
Ok to place order for labs.  Needs cbc, met b, a1c, tsh, lipid panel, liver panel and urine microalb/cr ratio. I think she lives out of town. Not sure where she wants to have them done.

## 2022-03-24 NOTE — Telephone Encounter (Signed)
Patient called and wanted to know if Dr Nicki Reaper wanted her to do labs. She did cancel her up coming appointment and would not reschedule. She stated she would reschedule after having labs done.

## 2022-03-26 ENCOUNTER — Other Ambulatory Visit: Payer: Self-pay | Admitting: Internal Medicine

## 2022-03-26 DIAGNOSIS — E1165 Type 2 diabetes mellitus with hyperglycemia: Secondary | ICD-10-CM

## 2022-03-27 ENCOUNTER — Telehealth: Payer: BC Managed Care – PPO | Admitting: Internal Medicine

## 2022-04-15 ENCOUNTER — Other Ambulatory Visit: Payer: Self-pay

## 2022-04-15 NOTE — Telephone Encounter (Signed)
LM once again to call back need to know where labs should be ordered to.

## 2022-04-28 ENCOUNTER — Telehealth: Payer: Self-pay | Admitting: Internal Medicine

## 2022-04-28 ENCOUNTER — Telehealth: Payer: Self-pay

## 2022-04-28 DIAGNOSIS — E7800 Pure hypercholesterolemia, unspecified: Secondary | ICD-10-CM

## 2022-04-28 DIAGNOSIS — I10 Essential (primary) hypertension: Secondary | ICD-10-CM

## 2022-04-28 DIAGNOSIS — E1165 Type 2 diabetes mellitus with hyperglycemia: Secondary | ICD-10-CM

## 2022-04-28 NOTE — Telephone Encounter (Signed)
Patient states she can have her labs drawn here in our clinic, if she can come on either 05/02/2022 or 05/21/2022.  I let patient know that I will send this message to Dr. Alisa Graff nurse.  I checked and we do not currently have lab orders in the system.

## 2022-04-28 NOTE — Addendum Note (Signed)
Addended by: Alisa Graff on: 04/28/2022 11:47 PM   Modules accepted: Orders

## 2022-04-28 NOTE — Telephone Encounter (Signed)
Patient has a lab appt 05/02/2022, there are no orders in.

## 2022-04-28 NOTE — Telephone Encounter (Signed)
I just noticed message from Dr. [name].  I have scheduled patient for a lab appointment on 05/02/2022.

## 2022-04-28 NOTE — Telephone Encounter (Signed)
Order placed for future labs

## 2022-05-02 ENCOUNTER — Other Ambulatory Visit (INDEPENDENT_AMBULATORY_CARE_PROVIDER_SITE_OTHER): Payer: BC Managed Care – PPO

## 2022-05-02 DIAGNOSIS — E1165 Type 2 diabetes mellitus with hyperglycemia: Secondary | ICD-10-CM | POA: Diagnosis not present

## 2022-05-02 DIAGNOSIS — E7800 Pure hypercholesterolemia, unspecified: Secondary | ICD-10-CM

## 2022-05-02 LAB — HEPATIC FUNCTION PANEL
ALT: 17 U/L (ref 0–35)
AST: 15 U/L (ref 0–37)
Albumin: 4.5 g/dL (ref 3.5–5.2)
Alkaline Phosphatase: 53 U/L (ref 39–117)
Bilirubin, Direct: 0.1 mg/dL (ref 0.0–0.3)
Total Bilirubin: 0.8 mg/dL (ref 0.2–1.2)
Total Protein: 7.3 g/dL (ref 6.0–8.3)

## 2022-05-02 LAB — LIPID PANEL
Cholesterol: 128 mg/dL (ref 0–200)
HDL: 39.1 mg/dL (ref >39.00)
LDL Cholesterol: 60 mg/dL (ref 0–99)
NonHDL: 89.32
Total CHOL/HDL Ratio: 3
Triglycerides: 145 mg/dL (ref 0.0–149.0)
VLDL: 29 mg/dL (ref 0.0–40.0)

## 2022-05-02 LAB — MICROALBUMIN / CREATININE URINE RATIO
Creatinine,U: 130.4 mg/dL
Microalb Creat Ratio: 0.9 mg/g (ref 0.0–30.0)
Microalb, Ur: 1.2 mg/dL (ref 0.0–1.9)

## 2022-05-02 LAB — BASIC METABOLIC PANEL
BUN: 14 mg/dL (ref 6–23)
CO2: 29 mEq/L (ref 19–32)
Calcium: 9.9 mg/dL (ref 8.4–10.5)
Chloride: 101 mEq/L (ref 96–112)
Creatinine, Ser: 0.48 mg/dL (ref 0.40–1.20)
GFR: 102.78 mL/min (ref >60.00)
Glucose, Bld: 93 mg/dL (ref 70–99)
Potassium: 4.3 mEq/L (ref 3.5–5.1)
Sodium: 137 mEq/L (ref 135–145)

## 2022-05-02 LAB — CBC WITH DIFFERENTIAL/PLATELET
Basophils Absolute: 0.1 10*3/uL (ref 0.0–0.1)
Basophils Relative: 1.3 % (ref 0.0–3.0)
Eosinophils Absolute: 0.2 10*3/uL (ref 0.0–0.7)
Eosinophils Relative: 2.7 % (ref 0.0–5.0)
HCT: 42.2 % (ref 36.0–46.0)
Hemoglobin: 14.3 g/dL (ref 12.0–15.0)
Lymphocytes Relative: 36.3 % (ref 12.0–46.0)
Lymphs Abs: 2.7 10*3/uL (ref 0.7–4.0)
MCHC: 33.9 g/dL (ref 30.0–36.0)
MCV: 89.8 fl (ref 78.0–100.0)
Monocytes Absolute: 0.4 10*3/uL (ref 0.1–1.0)
Monocytes Relative: 5.1 % (ref 3.0–12.0)
Neutro Abs: 4 10*3/uL (ref 1.4–7.7)
Neutrophils Relative %: 54.6 % (ref 43.0–77.0)
Platelets: 369 10*3/uL (ref 150.0–400.0)
RBC: 4.7 Mil/uL (ref 3.87–5.11)
RDW: 13.3 % (ref 11.5–15.5)
WBC: 7.4 10*3/uL (ref 4.0–10.5)

## 2022-05-02 LAB — TSH: TSH: 5.19 u[IU]/mL (ref 0.35–5.50)

## 2022-05-02 LAB — HEMOGLOBIN A1C: Hgb A1c MFr Bld: 6.6 % — ABNORMAL HIGH (ref 4.6–6.5)

## 2022-07-04 ENCOUNTER — Other Ambulatory Visit: Payer: Self-pay | Admitting: Internal Medicine

## 2022-07-04 DIAGNOSIS — E1165 Type 2 diabetes mellitus with hyperglycemia: Secondary | ICD-10-CM

## 2022-08-01 ENCOUNTER — Other Ambulatory Visit: Payer: Self-pay | Admitting: Internal Medicine

## 2022-08-26 ENCOUNTER — Other Ambulatory Visit: Payer: Self-pay | Admitting: Internal Medicine

## 2022-08-27 ENCOUNTER — Other Ambulatory Visit: Payer: Self-pay | Admitting: Internal Medicine

## 2022-09-23 ENCOUNTER — Other Ambulatory Visit: Payer: Self-pay | Admitting: Internal Medicine

## 2022-09-30 ENCOUNTER — Other Ambulatory Visit: Payer: Self-pay | Admitting: Internal Medicine

## 2022-09-30 DIAGNOSIS — E1165 Type 2 diabetes mellitus with hyperglycemia: Secondary | ICD-10-CM

## 2022-09-30 NOTE — Telephone Encounter (Signed)
My chart sent to patient to schedule appt. Has not been seen in almost 1 year. Refill sent in

## 2022-12-20 ENCOUNTER — Other Ambulatory Visit: Payer: Self-pay | Admitting: Internal Medicine

## 2022-12-20 DIAGNOSIS — E1165 Type 2 diabetes mellitus with hyperglycemia: Secondary | ICD-10-CM

## 2022-12-25 ENCOUNTER — Other Ambulatory Visit: Payer: Self-pay | Admitting: Internal Medicine

## 2022-12-25 NOTE — Telephone Encounter (Signed)
LMTCB. Medication has been refilled for 30 days only. Pt is due for an appointment with Dr. Lorin Picket.

## 2023-01-18 ENCOUNTER — Other Ambulatory Visit: Payer: Self-pay | Admitting: Internal Medicine

## 2023-01-22 ENCOUNTER — Other Ambulatory Visit: Payer: Self-pay | Admitting: Internal Medicine

## 2023-01-22 NOTE — Telephone Encounter (Signed)
Has not been seen in over 1 year. Needs appt. My chart sent to patient/

## 2023-01-28 NOTE — Telephone Encounter (Signed)
Ok to schedule in person visit when she is able to schedule.  Needs labs.  Should be able to get labs  prior to appt

## 2023-02-02 NOTE — Telephone Encounter (Signed)
LMTCB. Please schedule her an in office appt with Dr Lorin Picket in October and ask her if she would be able to get labs where she lives prior to her appt. If so, ask where they need to be sent.

## 2023-02-02 NOTE — Telephone Encounter (Signed)
Pt returned [place] LPN call. Note below was read to her. Pt stated she will find out the location and will sent a MyChart message with that info. Pt also stated she will call back and make that appt in October when she get a chance.

## 2023-02-03 NOTE — Telephone Encounter (Signed)
Noted  

## 2023-02-16 ENCOUNTER — Encounter: Payer: Self-pay | Admitting: Internal Medicine

## 2023-02-16 DIAGNOSIS — I10 Essential (primary) hypertension: Secondary | ICD-10-CM

## 2023-02-16 DIAGNOSIS — E1165 Type 2 diabetes mellitus with hyperglycemia: Secondary | ICD-10-CM

## 2023-02-16 DIAGNOSIS — E7800 Pure hypercholesterolemia, unspecified: Secondary | ICD-10-CM

## 2023-02-18 ENCOUNTER — Telehealth: Payer: Self-pay

## 2023-02-18 ENCOUNTER — Other Ambulatory Visit: Payer: Self-pay | Admitting: Nurse Practitioner

## 2023-02-18 ENCOUNTER — Encounter: Payer: Self-pay | Admitting: Nurse Practitioner

## 2023-02-18 ENCOUNTER — Telehealth (INDEPENDENT_AMBULATORY_CARE_PROVIDER_SITE_OTHER): Payer: BC Managed Care – PPO | Admitting: Nurse Practitioner

## 2023-02-18 VITALS — Ht 62.0 in | Wt 200.0 lb

## 2023-02-18 DIAGNOSIS — U071 COVID-19: Secondary | ICD-10-CM | POA: Insufficient documentation

## 2023-02-18 DIAGNOSIS — B370 Candidal stomatitis: Secondary | ICD-10-CM

## 2023-02-18 MED ORDER — NYSTATIN 100000 UNIT/ML MT SUSP
5.0000 mL | Freq: Four times a day (QID) | OROMUCOSAL | 0 refills | Status: DC
Start: 2023-02-18 — End: 2023-02-18

## 2023-02-18 NOTE — Assessment & Plan Note (Signed)
Day 6 of symptoms, tested positive 2 days ago. Symptom improvement with over the counter treatment, mostly resolved. She will continue OTC cough medication and Tylenol as needed. Encouraged adequate fluid intake. Counseled on quarantine protocol. Return precautions given to patient.

## 2023-02-18 NOTE — Telephone Encounter (Signed)
Please call her and confirm she is doing ok.  Message states she tested positive for covid.  Need to let her know that I am not in the office this week.  Confirm does not need to be seen. Also, I have ordered the labs.  I ordered as lab collect and other (for location).  They will probably need to be printed and faxed or mailed to her.  Can clarify with her.

## 2023-02-18 NOTE — Progress Notes (Signed)
MyChart Video Visit    Virtual Visit via Video Note   This visit type was conducted because this format is felt to be most appropriate for this patient at this time. Physical exam was limited by quality of the video and audio technology used for the visit. CMA was able to get the patient set up on a video visit.  Patient location: Home. Patient and provider in visit Provider location: Office  I discussed the limitations of evaluation and management by telemedicine and the availability of in person appointments. The patient expressed understanding and agreed to proceed.  Visit Date: 02/18/2023  Today's healthcare provider: Bethanie Dicker, NP     Subjective:    Patient ID: Caroline Meyers, female    DOB: 1961-01-08, 62 y.o.   MRN: [id number]  Chief Complaint  Patient presents with   Covid Positive    Tested on Monday and was positive started having sx's Friday was hoarse, stuffy, slight fever, then developed a slight sore throat. No longer has a fever. Has white spots on the back of her throat. No fatigue, just still stuffy     HPI Patient with symptoms since Friday. She tested positive for COVID at home 2 days ago. Her symptoms have greatly improved since. However, she noticed yesterday afternoon white spots that appeared on the roof of her mouth and near her tonsils. She denies pain with swallowing. She does not feel that her tonsils are red or enlarged.  Respiratory illness:  Cough- Yes, mild  Congestion-    Sinus- Yes   Chest- No  Post nasal drip- No  Sore throat- No, irritated  Shortness of breath- No  Fever- No  Fatigue/Myalgia- No Headache- Yes Nausea/Vomiting- No Taste disturbance- Yes  Smell disturbance- No  Covid exposure- No  Covid vaccination- x 2  Flu vaccination- Not due  Medications- OTC cough medication, Nyquil, Tylenol  Past Medical History:  Diagnosis Date   Achilles tendon tear    age 62   Environmental allergies    Hypercholesteremia     Hyperglycemia    Hypertension    Pre-diabetes    Sleep apnea     Past Surgical History:  Procedure Laterality Date   BREAST BIOPSY Right 2008   cyst removed   COLONOSCOPY WITH PROPOFOL N/A 03/08/2018   Procedure: COLONOSCOPY WITH PROPOFOL;  Surgeon: Wyline Mood, MD;  Location: [facility] ENDOSCOPY;  Service: Gastroenterology;  Laterality: N/A;   lumbar microdiskectomy  07/03/09    Family History  Problem Relation Age of Onset   Alzheimer's disease Other        grandmother   Heart disease Other        myocardial infarction (66s)- grandfather   Hypercholesterolemia Mother    Hypercholesterolemia Sister    Breast cancer Neg Hx    Colon cancer Neg Hx     Social History   Socioeconomic History   Marital status: Married    Spouse name: Not on file   Number of children: 1   Years of education: Not on file   Highest education level: Not on file  Occupational History   Occupation: Magazine features editor: [employer]  Tobacco Use   Smoking status: Never   Smokeless tobacco: Never  Substance and Sexual Activity   Alcohol use: No    Alcohol/week: 0.0 standard drinks of alcohol   Drug use: Yes    Types: Hydrocodone   Sexual activity: Not on file  Other Topics Concern  Not on file  Social History Narrative   Not on file   Social Determinants of Health   Financial Resource Strain: Low Risk  (06/11/2021)   Overall Financial Resource Strain (CARDIA)    Difficulty of Paying Living Expenses: Not hard at all  Food Insecurity: Not on file  Transportation Needs: Not on file  Physical Activity: Not on file  Stress: Not on file  Social Connections: Not on file  Intimate Partner Violence: Not on file    Outpatient Medications Prior to Visit  Medication Sig Dispense Refill   acetaminophen (TYLENOL) 325 MG tablet Take 650 mg by mouth as needed for headache.     metFORMIN (GLUCOPHAGE-XR) 500 MG 24 hr tablet TAKE 1 TABLET BY MOUTH TWICE A DAY 180 tablet 1    rosuvastatin (CRESTOR) 10 MG tablet TAKE 1 TABLET BY MOUTH EVERY DAY 30 tablet 0   Semaglutide,0.25 or 0.5MG /DOS, (OZEMPIC, 0.25 OR 0.5 MG/DOSE,) 2 MG/3ML SOPN INJECT 0.5 MG INTO THE SKIN ONE TIME PER WEEK 2 mL 2   glucose blood (ONETOUCH VERIO) test strip CHECK BLOOD SUGAR ONCE DAILY 100 each 6   Lancets (ONETOUCH ULTRASOFT) lancets Use as instructed to check blood sugars once daily. 100 each 12   Probiotic Product (PROBIOTIC DAILY PO) Take by mouth.     No facility-administered medications prior to visit.    Allergies  Allergen Reactions   Shellfish Allergy Shortness Of Breath and Swelling    "throat closing"   Aspirin Hives   Fd&C Yellow #5 (Tartrazine) Hives   Penicillins     ROS See HPI    Objective:    Physical Exam  Ht 5\' 2"  (1.575 m)   Wt 200 lb (90.7 kg)   LMP 03/09/2012   BMI 36.58 kg/m  Wt Readings from Last 3 Encounters:  02/18/23 200 lb (90.7 kg)  12/05/21 200 lb (90.7 kg)  08/07/21 200 lb 6.4 oz (90.9 kg)   GENERAL: alert, oriented, appears well and in no acute distress   HEENT: atraumatic, conjunttiva clear, no obvious abnormalities on inspection of external nose and ears. Able to visualize white spots on hard and soft palate, none noted on tonsils or tongue. Tonsils not enlarged or erythematous.    NECK: normal movements of the head and neck   LUNGS: on inspection no signs of respiratory distress, breathing rate appears normal, no obvious gross SOB, gasping or wheezing   CV: no obvious cyanosis   MS: moves all visible extremities without noticeable abnormality   PSYCH/NEURO: pleasant and cooperative, no obvious depression or anxiety, speech and thought processing grossly intact    Assessment & Plan:   Problem List Items Addressed This Visit       Digestive   Oral thrush - Primary    Consistent with thrush. Will treat with Nystatin suspension. She will contact if her symptoms are changing or persisting.       Relevant Medications   nystatin  (MYCOSTATIN) 100000 UNIT/ML suspension     Other   Positive self-administered antigen test for COVID-19    Day 6 of symptoms, tested positive 2 days ago. Symptom improvement with over the counter treatment, mostly resolved. She will continue OTC cough medication and Tylenol as needed. Encouraged adequate fluid intake. Counseled on quarantine protocol. Return precautions given to patient.        I am having Caroline Meyers start on nystatin. I am also having her maintain her acetaminophen, glucose blood, onetouch ultrasoft, Probiotic Product (PROBIOTIC DAILY PO), metFORMIN,  Ozempic (0.25 or 0.5 MG/DOSE), and rosuvastatin.  Meds ordered this encounter  Medications   nystatin (MYCOSTATIN) 100000 UNIT/ML suspension    Sig: Take 5 mLs (500,000 Units total) by mouth 4 (four) times daily. Swish around mouth and retain for as long as possible before swallowing. X 7-10 days    Dispense:  473 mL    Refill:  0    Order Specific Question:   Supervising Provider    Answer:   Birdie Sons, ERIC G [[id number]]    I discussed the assessment and treatment plan with the patient. The patient was provided an opportunity to ask questions and all were answered. The patient agreed with the plan and demonstrated an understanding of the instructions.   The patient was advised to call back or seek an in-person evaluation if the symptoms worsen or if the condition fails to improve as anticipated.   Bethanie Dicker, NP [facility] [phone] (phone) [phone] (fax)  [facility]

## 2023-02-18 NOTE — Assessment & Plan Note (Signed)
Consistent with thrush. Will treat with Nystatin suspension. She will contact if her symptoms are changing or persisting.

## 2023-02-18 NOTE — Telephone Encounter (Signed)
Called pt to see if she would like to get started on her virtual appt pt stated she was out to the store and would not be back home for an hour, I informed pt that I will call her a little later to see if she is home and if we have space to get her on earlier.

## 2023-02-23 NOTE — Telephone Encounter (Signed)
Pt did virtual with Kacy. Feeling better. Labs printed and mailed to patient per her request.

## 2023-02-25 ENCOUNTER — Other Ambulatory Visit: Payer: Self-pay | Admitting: Internal Medicine

## 2023-03-14 ENCOUNTER — Other Ambulatory Visit: Payer: Self-pay | Admitting: Internal Medicine

## 2023-03-14 DIAGNOSIS — E1165 Type 2 diabetes mellitus with hyperglycemia: Secondary | ICD-10-CM

## 2023-05-13 NOTE — Telephone Encounter (Signed)
Error

## 2023-05-15 ENCOUNTER — Encounter: Payer: Self-pay | Admitting: *Deleted

## 2023-05-22 ENCOUNTER — Ambulatory Visit: Payer: BC Managed Care – PPO | Admitting: Internal Medicine

## 2023-05-23 ENCOUNTER — Other Ambulatory Visit: Payer: Self-pay | Admitting: Internal Medicine

## 2023-05-24 ENCOUNTER — Other Ambulatory Visit: Payer: Self-pay | Admitting: Internal Medicine

## 2023-05-24 DIAGNOSIS — E1165 Type 2 diabetes mellitus with hyperglycemia: Secondary | ICD-10-CM

## 2023-06-02 NOTE — Telephone Encounter (Signed)
LMTCB
